# Patient Record
Sex: Female | Born: 1955 | Race: White | Hispanic: No | Marital: Married | Smoking: Current every day smoker
Health system: Southern US, Community
[De-identification: ages and names within clinical notes are randomized; demographics above are authoritative.]

---

## 1998-12-01 ENCOUNTER — Encounter: Payer: Self-pay | Admitting: Emergency Medicine

## 1998-12-01 ENCOUNTER — Emergency Department (HOSPITAL_COMMUNITY): Admission: EM | Admit: 1998-12-01 | Discharge: 1998-12-01 | Payer: Self-pay | Admitting: Emergency Medicine

## 2000-10-31 ENCOUNTER — Encounter: Payer: Self-pay | Admitting: Emergency Medicine

## 2000-10-31 ENCOUNTER — Emergency Department (HOSPITAL_COMMUNITY): Admission: EM | Admit: 2000-10-31 | Discharge: 2000-10-31 | Payer: Self-pay | Admitting: Emergency Medicine

## 2000-11-07 ENCOUNTER — Encounter: Admission: RE | Admit: 2000-11-07 | Discharge: 2000-11-07 | Payer: Self-pay | Admitting: Family Medicine

## 2000-11-07 ENCOUNTER — Encounter: Payer: Self-pay | Admitting: Family Medicine

## 2000-11-13 ENCOUNTER — Ambulatory Visit (HOSPITAL_COMMUNITY): Admission: RE | Admit: 2000-11-13 | Discharge: 2000-11-13 | Payer: Self-pay | Admitting: Family Medicine

## 2000-11-13 ENCOUNTER — Encounter: Payer: Self-pay | Admitting: Family Medicine

## 2013-10-04 ENCOUNTER — Emergency Department (HOSPITAL_COMMUNITY)
Admission: EM | Admit: 2013-10-04 | Discharge: 2013-10-04 | Disposition: A | Discharge status: Home / Self Care | Payer: 59 | Source: Home / Self Care

## 2013-10-04 ENCOUNTER — Emergency Department (HOSPITAL_COMMUNITY): Payer: 59

## 2013-10-04 ENCOUNTER — Emergency Department (INDEPENDENT_AMBULATORY_CARE_PROVIDER_SITE_OTHER): Payer: 59

## 2013-10-04 ENCOUNTER — Encounter (HOSPITAL_COMMUNITY): Payer: Self-pay | Admitting: Emergency Medicine

## 2013-10-04 DIAGNOSIS — S62113A Displaced fracture of triquetrum [cuneiform] bone, unspecified wrist, initial encounter for closed fracture: Secondary | ICD-10-CM

## 2013-10-04 MED ORDER — KETOROLAC TROMETHAMINE 60 MG/2ML IM SOLN
INTRAMUSCULAR | Status: AC
  Filled 2013-10-04: qty 2

## 2013-10-04 MED ORDER — KETOROLAC TROMETHAMINE 60 MG/2ML IM SOLN
60.0000 mg | Freq: Once | INTRAMUSCULAR | Status: AC
  Administered 2013-10-04: 60 mg via INTRAMUSCULAR

## 2013-10-04 MED ORDER — HYDROCODONE-ACETAMINOPHEN 5-325 MG PO TABS: 1.0000 | ORAL_TABLET | ORAL | Status: DC | PRN

## 2013-10-04 NOTE — Discharge Instructions (Signed)
Call Dr. Merrilee Seashore office Monday morning to schedule an appointment for follow up.    Cast or Splint Care Casts and splints support injured limbs and keep bones from moving while they heal. It is important to care for your cast or splint at home.  HOME CARE INSTRUCTIONS  Keep the cast or splint uncovered during the drying period. It can take 24 to 48 hours to dry if it is made of plaster. A fiberglass cast will dry in less than 1 hour.  Do not rest the cast on anything harder than a pillow for the first 24 hours.  Do not put weight on your injured limb or apply pressure to the cast until your health care provider gives you permission.  Keep the cast or splint dry. Wet casts or splints can lose their shape and may not support the limb as well. A wet cast that has lost its shape can also create harmful pressure on your skin when it dries. Also, wet skin can become infected.  Cover the cast or splint with a plastic bag when bathing or when out in the rain or snow. If the cast is on the trunk of the body, take sponge baths until the cast is removed.  If your cast does become wet, dry it with a towel or a blow dryer on the cool setting only.  Keep your cast or splint clean. Soiled casts may be wiped with a moistened cloth.  Do not place any hard or soft foreign objects under your cast or splint, such as cotton, toilet paper, lotion, or powder.  Do not try to scratch the skin under the cast with any object. The object could get stuck inside the cast. Also, scratching could lead to an infection. If itching is a problem, use a blow dryer on a cool setting to relieve discomfort.  Do not trim or cut your cast or remove padding from inside of it.  Exercise all joints next to the injury that are not immobilized by the cast or splint. For example, if you have a long leg cast, exercise the hip joint and toes. If you have an arm cast or splint, exercise the shoulder, elbow, thumb, and fingers.  Elevate  your injured arm or leg on 1 or 2 pillows for the first 1 to 3 days to decrease swelling and pain.It is best if you can comfortably elevate your cast so it is higher than your heart. SEEK MEDICAL CARE IF:   Your cast or splint cracks.  Your cast or splint is too tight or too loose.  You have unbearable itching inside the cast.  Your cast becomes wet or develops a soft spot or area.  You have a bad smell coming from inside your cast.  You get an object stuck under your cast.  Your skin around the cast becomes red or raw.  You have new pain or worsening pain after the cast has been applied. SEEK IMMEDIATE MEDICAL CARE IF:   You have fluid leaking through the cast.  You are unable to move your fingers or toes.  You have discolored (blue or white), cool, painful, or very swollen fingers or toes beyond the cast.  You have tingling or numbness around the injured area.  You have severe pain or pressure under the cast.  You have any difficulty with your breathing or have shortness of breath.  You have chest pain. Document Released: 08/18/2000 Document Revised: 06/11/2013 Document Reviewed: 02/27/2013 [facility] Patient Information 2014 [place].   Hand Fracture Your caregiver has diagnosed you with a fractured (broken) bone in your hand. If the bones are in good position and the hand is properly immobilized and rested, these injuries will usually heal in 3 to 6 weeks. A cast, splint, or bulky bandage is usually applied to keep the fracture site from moving. Do not remove the splint or cast until your caregiver approves. If the fracture is unstable or the bones are not aligned properly, surgery may be needed. Keep your hand raised (elevated) above the level of your heart as much as possible for the next 2 to 3 days until the swelling and pain are better. Apply ice packs for 15-20 minutes every 3 to 4 hours to help control the pain and swelling. See your caregiver or an orthopedic  specialist as directed for follow-up care to make sure the fracture is beginning to heal properly. SEEK IMMEDIATE MEDICAL CARE IF:   You notice your fingers are cold, numb, crooked, or the pain of your injury is severe.  You are not improving or seem to be getting worse.  You have questions or concerns. Document Released: 09/28/2004 Document Revised: 11/13/2011 Document Reviewed: 02/16/2009 [facility] Patient Information 2014 [place], [place].  RICE: Routine Care for Injuries The routine care of many injuries includes Rest, Ice, Compression, and Elevation (RICE). HOME CARE INSTRUCTIONS  Rest is needed to allow your body to heal. Routine activities can usually be resumed when comfortable. Injured tendons and bones can take up to 6 weeks to heal. Tendons are the cord-like structures that attach muscle to bone.  Ice following an injury helps keep the swelling down and reduces pain.  Put ice in a plastic bag.  Place a towel between your skin and the bag.  Leave the ice on for 15-20 minutes, 03-04 times a day. Do this while awake, for the first 24 to 48 hours. After that, continue as directed by your caregiver.  Compression helps keep swelling down. It also gives support and helps with discomfort. If an elastic bandage has been applied, it should be removed and reapplied every 3 to 4 hours. It should not be applied tightly, but firmly enough to keep swelling down. Watch fingers or toes for swelling, bluish discoloration, coldness, numbness, or excessive pain. If any of these problems occur, remove the bandage and reapply loosely. Contact your caregiver if these problems continue.  Elevation helps reduce swelling and decreases pain. With extremities, such as the arms, hands, legs, and feet, the injured area should be placed near or above the level of the heart, if possible. SEEK IMMEDIATE MEDICAL CARE IF:  You have persistent pain and swelling.  You develop redness, numbness, or unexpected  weakness.  Your symptoms are getting worse rather than improving after several days. These symptoms may indicate that further evaluation or further X-rays are needed. Sometimes, X-rays may not show a small broken bone (fracture) until 1 week or 10 days later. Make a follow-up appointment with your caregiver. Ask when your X-ray results will be ready. Make sure you get your X-ray results. Document Released: 12/03/2000 Document Revised: 11/13/2011 Document Reviewed: 01/20/2011 [facility] Patient Information 2014 [place], [place].

## 2013-10-04 NOTE — ED Provider Notes (Signed)
CSN: [id number]     Arrival date & time 10/04/13  1842 History   None    Chief Complaint  Patient presents with  . Wrist Injury  . Joint Swelling   (Consider location/radiation/quality/duration/timing/severity/associated sxs/prior Treatment)  HPI  The patient is a 58 year old female presenting following a fall roller skating onto her right wrist with her forearm extended.  Patient is reporting pain in her right wrist and states she's unable to fully extend her right elbow.   History reviewed. No pertinent past medical history. Past Surgical History  Procedure Laterality Date  . Cesarean section  1980 and 1986     x 2   Family History  Problem Relation Age of Onset  . Aplastic anemia Mother    History  Substance Use Topics  . Smoking status: Current Every Day Smoker -- 1.00 packs/day    Types: Cigarettes  . Smokeless tobacco: Not on file  . Alcohol Use: Yes     Comment: rarely   OB History   Grav Para Term Preterm Abortions TAB SAB Ect Mult Living                 Review of Systems  Constitutional: Negative.   HENT: Negative.   Eyes: Negative.   Respiratory: Negative.   Cardiovascular: Negative.   Gastrointestinal: Negative.   Endocrine: Negative.   Genitourinary: Negative.   Musculoskeletal: Positive for joint swelling.  Skin: Negative.   Allergic/Immunologic: Negative.   Neurological: Negative.   Hematological: Negative.   Psychiatric/Behavioral: Negative.     Allergies  Review of patient's allergies indicates no known allergies.  Home Medications   Current Outpatient Rx  Name  Route  Sig  Dispense  Refill  . HYDROcodone-acetaminophen (NORCO/VICODIN) 5-325 MG per tablet   Oral   Take 1-2 tablets by mouth every 4 (four) hours as needed.   20 tablet   0    There were no vitals taken for this visit. Physical Exam  Nursing note and vitals reviewed. Constitutional: She is oriented to person, place, and time. She appears well-developed and  well-nourished. No distress.  Eyes: Pupils are equal, round, and reactive to light.  Cardiovascular: Normal rate, regular rhythm, normal heart sounds and intact distal pulses.  Exam reveals no gallop and no friction rub.   No murmur heard. Pulmonary/Chest: Effort normal and breath sounds normal. No respiratory distress. She has no wheezes. She has no rales. She exhibits no tenderness.  Musculoskeletal: She exhibits edema and tenderness.       Right elbow: She exhibits no swelling, no effusion, no deformity and no laceration. Tenderness found. No radial head, no medial epicondyle, no lateral epicondyle and no olecranon process tenderness noted.       Right wrist: She exhibits tenderness, bony tenderness and swelling. She exhibits normal range of motion, no effusion, no crepitus, no deformity and no laceration.  Patient has full active range of motion, however reports straining discomfort. Passive range of motion is limited due to patient discomfort. No discomfort noted over bony prominences of right elbow no swelling effusion or deformity skin intact. Tenderness is present in soft tissue.  The right wrist is without obvious asymmetry or deformity compared to the left wrist. There is no surface trauma or open wounds. No scaphoid fullness or tenderness over patient reports tenderness over triquetrum.  Motor sensory function  intact and 2+ radial pulses present. Refill less than 3 seconds to distal phalanges.   Neurological: She is alert and oriented to  person, place, and time.  Skin: She is not diaphoretic.    ED Course  Procedures (including critical care time) Labs Review Labs Reviewed - No data to display Imaging Review Dg Elbow Complete Right  10/04/2013   CLINICAL DATA:  Larey SeatFell today with pain in the wrist, cannot straighten elbow  EXAM: RIGHT ELBOW - COMPLETE 3+ VIEW  COMPARISON:  None.  FINDINGS: There is no evidence of fracture, dislocation, or joint effusion. There is no evidence of  arthropathy or other focal bone abnormality. Soft tissues are unremarkable.  IMPRESSION: Negative.   Electronically Signed   By: Esperanza Heiraymond  Rubner M.D.   On: 10/04/2013 20:20   Dg Wrist Complete Right  10/04/2013   CLINICAL DATA:  Patient fell today with pain radial side of right wrist  EXAM: RIGHT WRIST - COMPLETE 3+ VIEW  COMPARISON:  None.  FINDINGS: On the lateral view there is a small bony fragment dorsally displaced at the level of the proximal carpal row. No other abnormalities except for mild arthritis of the first carpal metacarpal joint.  IMPRESSION: Findings suggest triquetrum fracture.   Electronically Signed   By: Esperanza Heiraymond  Rubner M.D.   On: 10/04/2013 20:18   Dr. Denyse Amassorey placed sugar tong splint with wrist anatomical position.  MDM   1. Triquetral chip fracture     Meds ordered this encounter  Medications  . ketorolac (TORADOL) injection 60 mg    Sig:   . HYDROcodone-acetaminophen (NORCO/VICODIN) 5-325 MG per tablet    Sig: Take 1-2 tablets by mouth every 4 (four) hours as needed.    Dispense:  20 tablet    Refill:  0    Plan of care discussed with Dr. Denyse Amassorey.  The patient to contact Dr. Merrilee SeashoreKuzma's office first thing Monday morning for follow up this week.     Weber Cooksatherine Rossi, NP 10/04/13 2048  I placed a well molded functional sugar tong splint.  Medical screening examination/treatment/procedure(s) were performed by a resident physician or non-physician practitioner and as the supervising physician I was immediately available for consultation/collaboration.  Clementeen GrahamEvan Corey, MD    Rodolph BongEvan S Corey, MD 10/06/13 [id number]

## 2013-10-04 NOTE — ED Notes (Signed)
[place] [employer] [place]  Fell on her R hand and her buttocks.  C/o pain R wrist and can't straighten her R elbow.  Has swelling in dorsum of R hand. R radial pulse present.

## 2016-05-16 ENCOUNTER — Ambulatory Visit (HOSPITAL_COMMUNITY)
Admission: EM | Admit: 2016-05-16 | Discharge: 2016-05-16 | Disposition: A | Discharge status: Home / Self Care | Payer: 59 | Attending: Family Medicine | Admitting: Family Medicine

## 2016-05-16 ENCOUNTER — Ambulatory Visit (INDEPENDENT_AMBULATORY_CARE_PROVIDER_SITE_OTHER): Payer: 59

## 2016-05-16 ENCOUNTER — Encounter (HOSPITAL_COMMUNITY): Payer: Self-pay | Admitting: Emergency Medicine

## 2016-05-16 DIAGNOSIS — M6580 Other synovitis and tenosynovitis, unspecified site: Secondary | ICD-10-CM

## 2016-05-16 DIAGNOSIS — M76892 Other specified enthesopathies of left lower limb, excluding foot: Secondary | ICD-10-CM

## 2016-05-16 MED ORDER — MELOXICAM 7.5 MG PO TABS: 7.5000 mg | ORAL_TABLET | Freq: Two times a day (BID) | ORAL | 1 refills | Status: DC

## 2016-05-16 NOTE — ED Provider Notes (Signed)
[facility]    CSN: [id number] Arrival date & time: 05/16/16  [id number]  First Provider Contact:  First MD Initiated Contact with Patient 05/16/16 1013        History   Chief Complaint Chief Complaint  Patient presents with  . Knee Pain    HPI Alice Lara is a 60 y.o. female.   The history is provided by the patient.  Knee Pain  Location:  Knee Time since incident:  2 weeks Injury: no   Knee location:  L knee Pain details:    Quality:  Aching   Severity:  Moderate   Onset quality:  Gradual   Duration:  2 weeks   Progression:  Unchanged Chronicity:  New Dislocation: no   Prior injury to area:  No Relieved by:  Compression and NSAIDs Associated symptoms: no back pain, no decreased ROM, no stiffness and no swelling     History reviewed. No pertinent past medical history.  There are no active problems to display for this patient.   Past Surgical History:  Procedure Laterality Date  . CESAREAN SECTION  1980 and 1986    x 2    OB History    No data available       Home Medications    Prior to Admission medications   Medication Sig Start Date End Date Taking? Authorizing Provider  ibuprofen (ADVIL,MOTRIN) 200 MG tablet Take 200 mg by mouth every 6 (six) hours as needed.   Yes Historical Provider, MD  HYDROcodone-acetaminophen (NORCO/VICODIN) 5-325 MG per tablet Take 1-2 tablets by mouth every 4 (four) hours as needed. Patient not taking: Reported on 05/16/2016 10/04/13   Servando Salinaatherine H Rossi, NP  meloxicam (MOBIC) 7.5 MG tablet Take 1 tablet (7.5 mg total) by mouth 2 (two) times daily after a meal. 05/16/16   Linna HoffJames D Kindl, MD    Family History Family History  Problem Relation Age of Onset  . Aplastic anemia Mother     Social History Social History  Substance Use Topics  . Smoking status: Current Every Day Smoker    Packs/day: 1.00    Types: Cigarettes  . Smokeless tobacco: Never Used  . Alcohol use Yes     Comment: rarely      Allergies   Review of patient's allergies indicates no known allergies.   Review of Systems Review of Systems  Musculoskeletal: Positive for arthralgias. Negative for back pain, gait problem, joint swelling and stiffness.  Skin: Negative.   All other systems reviewed and are negative.    Physical Exam Triage Vital Signs ED Triage Vitals [05/16/16 1012]  Enc Vitals Group     BP 158/85     Pulse Rate 65     Resp 14     Temp 98 F (36.7 C)     Temp Source Oral     SpO2 98 %     Weight      Height      Head Circumference      Peak Flow      Pain Score      Pain Loc      Pain Edu?      Excl. in GC?    No data found.   Updated Vital Signs BP 158/85 (BP Location: Right Arm)   Pulse 65   Temp 98 F (36.7 C) (Oral)   Resp 14   SpO2 98%   Visual Acuity Right Eye Distance:   Left Eye Distance:   Bilateral Distance:  Right Eye Near:   Left Eye Near:    Bilateral Near:     Physical Exam  Constitutional: She is oriented to person, place, and time. She appears well-developed and well-nourished.  Musculoskeletal: Normal range of motion. She exhibits tenderness. She exhibits no edema or deformity.       Left knee: She exhibits LCL laxity. She exhibits normal range of motion, no swelling, no effusion, no deformity, normal patellar mobility and no bony tenderness. Tenderness found. Lateral joint line tenderness noted.  Neurological: She is alert and oriented to person, place, and time.  Skin: Skin is warm and dry.  Nursing note and vitals reviewed.    UC Treatments / Results  Labs (all labs ordered are listed, but only abnormal results are displayed) Labs Reviewed - No data to display  EKG  EKG Interpretation None       Radiology No results found. X-rays reviewed and report per radiologist.  Procedures Procedures (including critical care time)  Medications Ordered in UC Medications - No data to display   Initial Impression / Assessment and  Plan / UC Course  I have reviewed the triage vital signs and the nursing notes.  Pertinent labs & imaging results that were available during my care of the patient were reviewed by me and considered in my medical decision making (see chart for details).  Clinical Course      Final Clinical Impressions(s) / UC Diagnoses   Final diagnoses:  Left knee tendonitis    New Prescriptions Discharge Medication List as of 05/16/2016 11:26 AM    START taking these medications   Details  meloxicam (MOBIC) 7.5 MG tablet Take 1 tablet (7.5 mg total) by mouth 2 (two) times daily after a meal., Starting Tue 05/16/2016, Print         Linna Hoff, MD 05/19/16 1011

## 2016-05-16 NOTE — Discharge Instructions (Signed)
Ice and meds as needed,continue cooper fit support,  see ortho if further problems

## 2016-05-16 NOTE — ED Triage Notes (Signed)
Patient denies any injury.  Denies prior history of any injury.  Patient reports left knee hurts with weight bearing or night.  The more walking, the more pain.  The later in the day pain worsens.    Patient has used ibuprofen and the "knee copper fit"

## 2021-06-17 ENCOUNTER — Other Ambulatory Visit: Payer: Self-pay | Admitting: Internal Medicine

## 2021-06-17 DIAGNOSIS — Z122 Encounter for screening for malignant neoplasm of respiratory organs: Secondary | ICD-10-CM

## 2021-06-22 ENCOUNTER — Other Ambulatory Visit: Payer: Self-pay | Admitting: Internal Medicine

## 2021-06-22 DIAGNOSIS — Z13820 Encounter for screening for osteoporosis: Secondary | ICD-10-CM

## 2021-06-23 ENCOUNTER — Other Ambulatory Visit: Payer: Self-pay | Admitting: Internal Medicine

## 2021-06-23 DIAGNOSIS — Z1231 Encounter for screening mammogram for malignant neoplasm of breast: Secondary | ICD-10-CM

## 2021-07-06 ENCOUNTER — Ambulatory Visit
Admission: RE | Admit: 2021-07-06 | Discharge: 2021-07-06 | Disposition: A | Discharge status: Home / Self Care | Payer: 59 | Source: Ambulatory Visit | Attending: Internal Medicine | Admitting: Internal Medicine

## 2021-07-06 DIAGNOSIS — Z1231 Encounter for screening mammogram for malignant neoplasm of breast: Secondary | ICD-10-CM

## 2021-11-02 ENCOUNTER — Other Ambulatory Visit: Payer: Self-pay

## 2021-11-02 ENCOUNTER — Ambulatory Visit
Admission: RE | Admit: 2021-11-02 | Discharge: 2021-11-02 | Disposition: A | Discharge status: Home / Self Care | Payer: 59 | Source: Ambulatory Visit | Attending: Nurse Practitioner | Admitting: Nurse Practitioner

## 2021-11-02 ENCOUNTER — Other Ambulatory Visit: Payer: Self-pay | Admitting: Nurse Practitioner

## 2021-11-02 ENCOUNTER — Ambulatory Visit
Admission: RE | Admit: 2021-11-02 | Discharge: 2021-11-02 | Disposition: A | Discharge status: Home / Self Care | Payer: No Typology Code available for payment source | Source: Ambulatory Visit | Attending: Nurse Practitioner | Admitting: Nurse Practitioner

## 2021-11-02 DIAGNOSIS — M25531 Pain in right wrist: Secondary | ICD-10-CM

## 2021-11-02 DIAGNOSIS — M79641 Pain in right hand: Secondary | ICD-10-CM

## 2023-06-29 ENCOUNTER — Other Ambulatory Visit: Payer: Self-pay | Admitting: Internal Medicine

## 2023-06-29 DIAGNOSIS — F17218 Nicotine dependence, cigarettes, with other nicotine-induced disorders: Secondary | ICD-10-CM

## 2023-08-21 IMAGING — CR DG WRIST COMPLETE 3+V*R*
4 series · 4 of 4 positions shown · non-contrast
Comparison: Concurrent hand radiograph, reported separately.

CLINICAL DATA: Right wrist pain. Fall catching self with right
hand, increasing pain. Question fracture.

EXAM:
RIGHT WRIST - COMPLETE 3+ VIEW

[x wrist lat right]
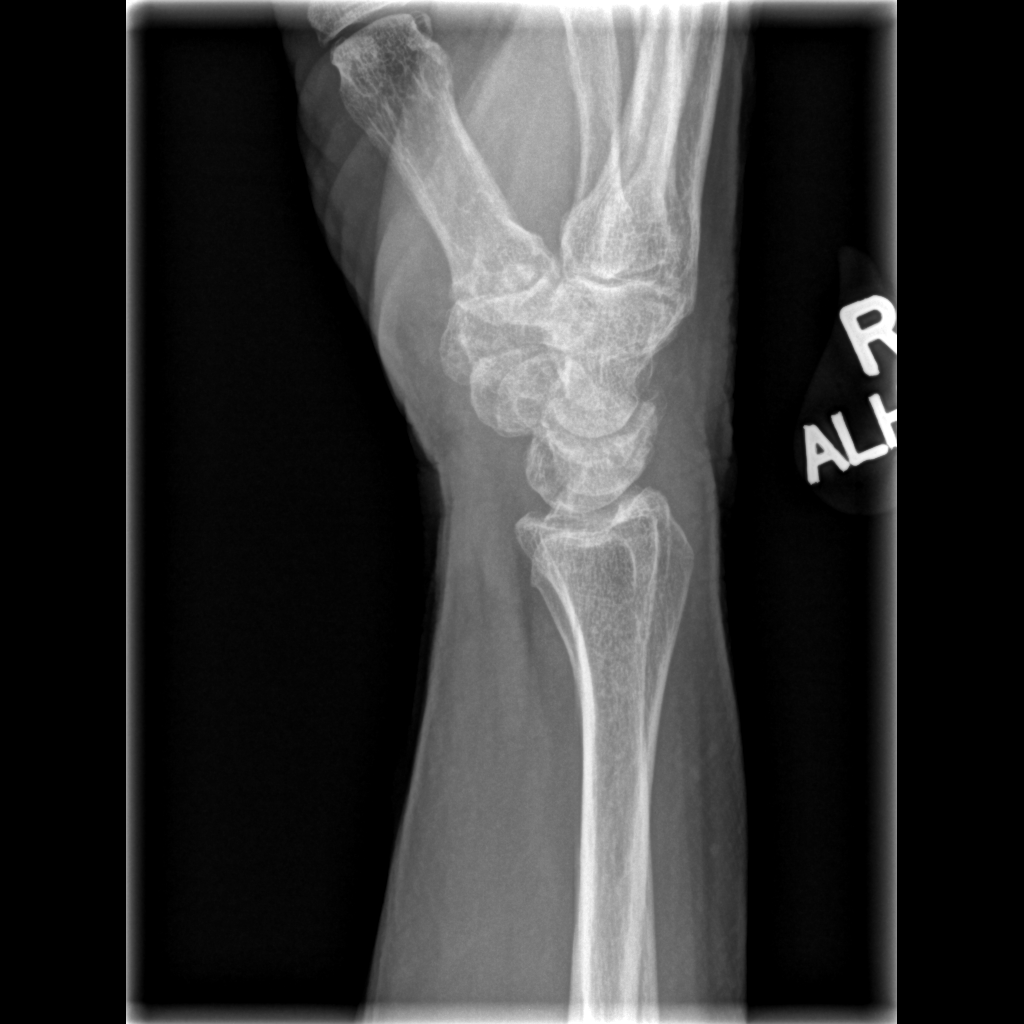

[x navicular]
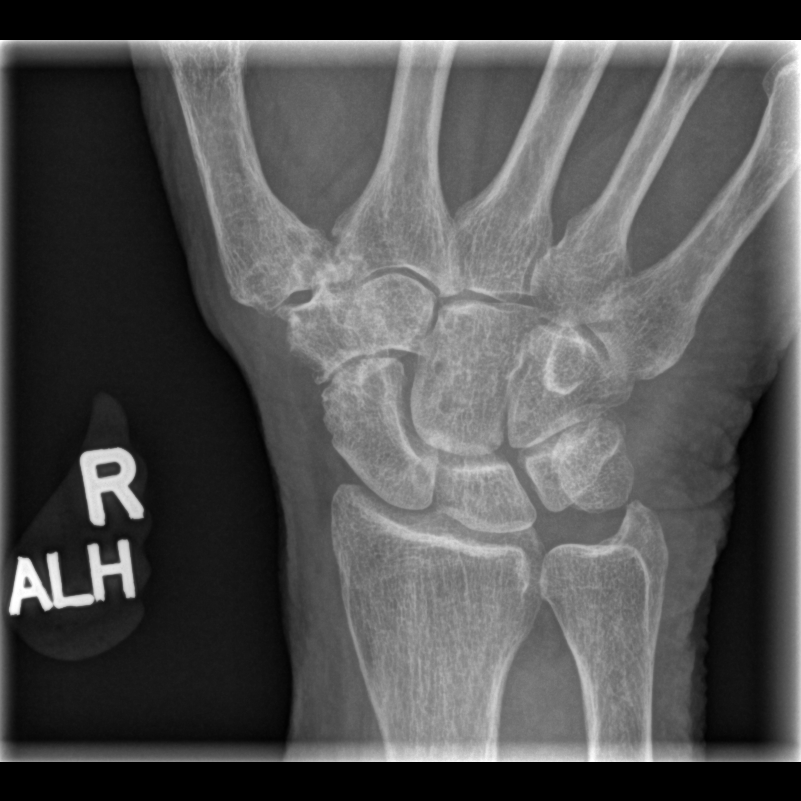

[x wrist pa right]
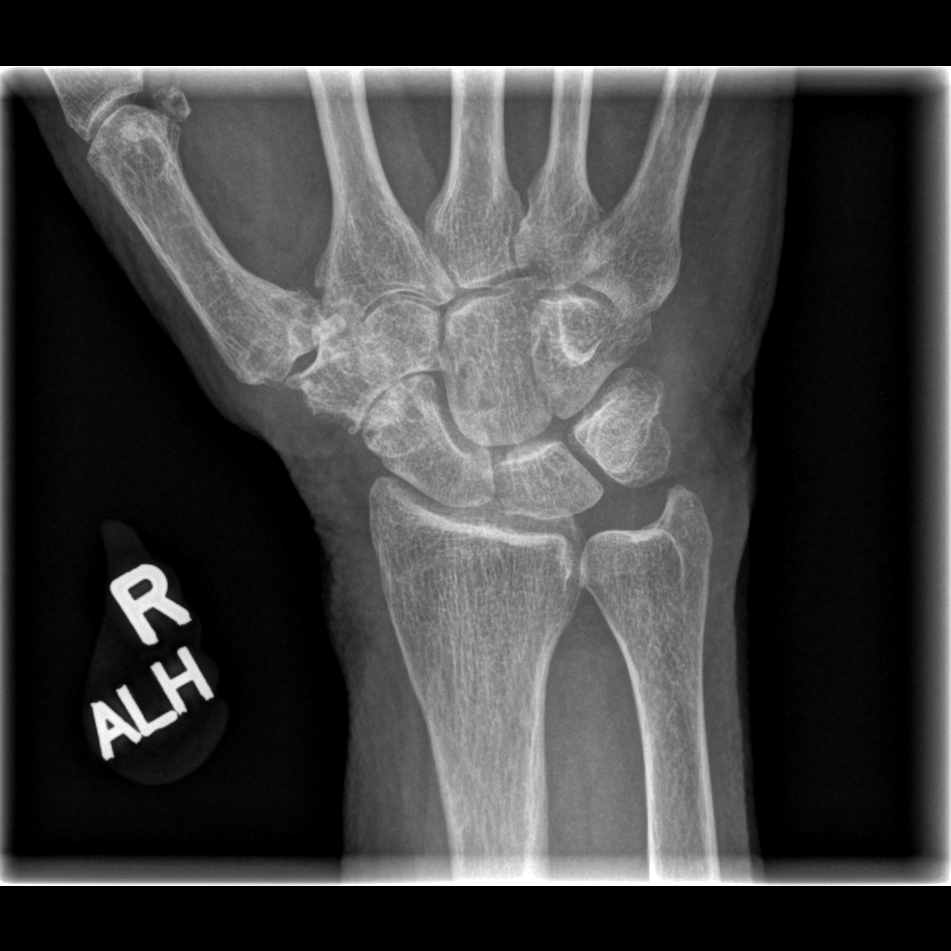

[x wrist obl right]
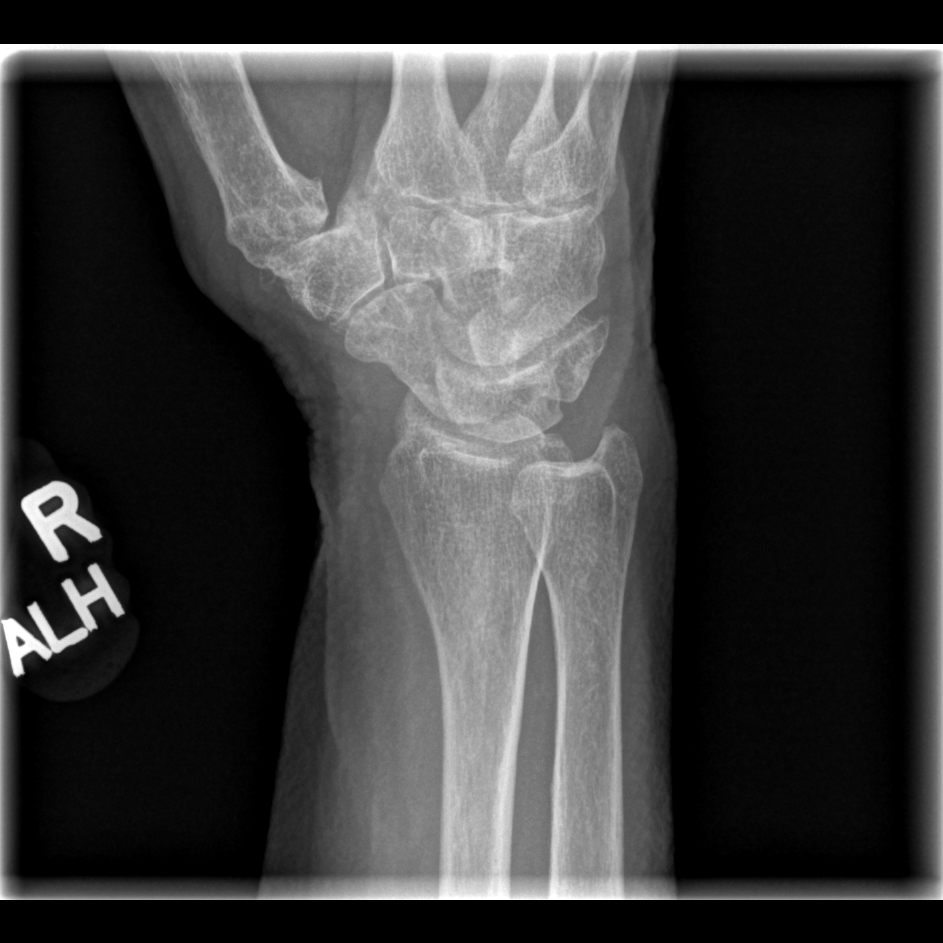

[4 of 4 positions shown; findings below may reference images not displayed]

FINDINGS: There is no evidence of fracture or dislocation. Normal alignment.
There are degenerative carpal bone cysts. Moderate osteoarthritis of
the thumb carpal metacarpal joint. Soft tissues are unremarkable.
IMPRESSION: 1. No acute fracture or subluxation.
2. Degenerative carpal bone cysts and thumb carpometacarpal joint
osteoarthritis.

## 2023-08-21 IMAGING — CR DG HAND COMPLETE 3+V*R*
3 series · 3 of 3 positions shown · non-contrast
Comparison: None.

CLINICAL DATA: Trauma, fall

EXAM:
RIGHT HAND - COMPLETE 3+ VIEW

[x hand pa right]
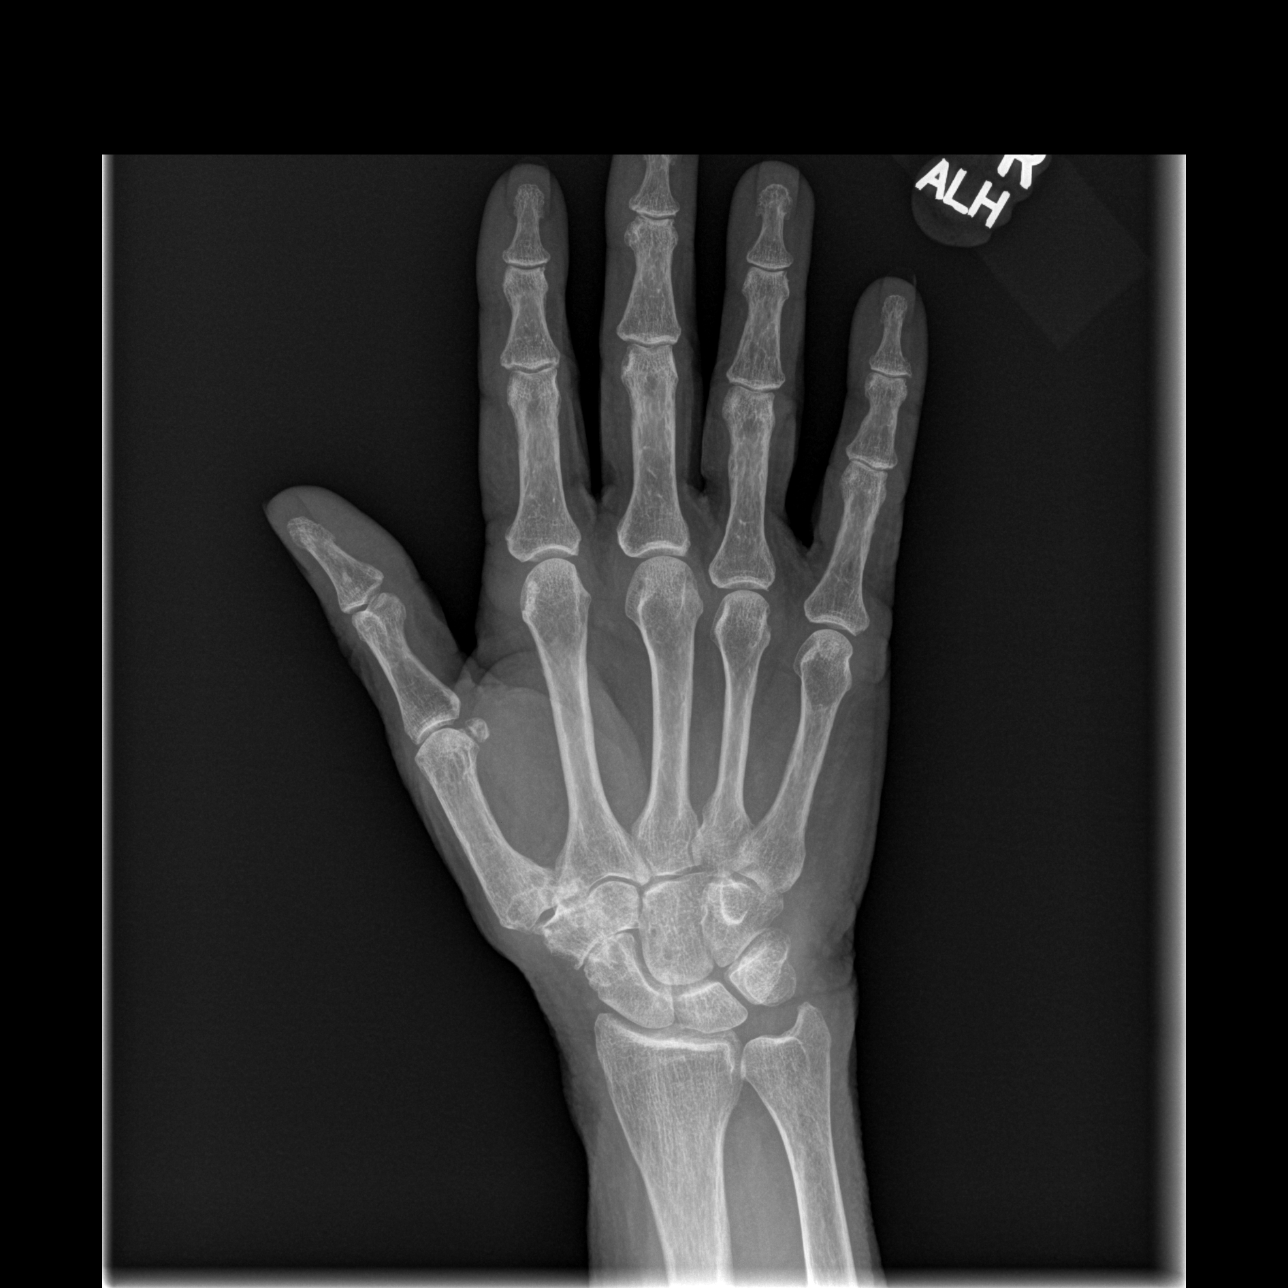

[x hand oblique right]
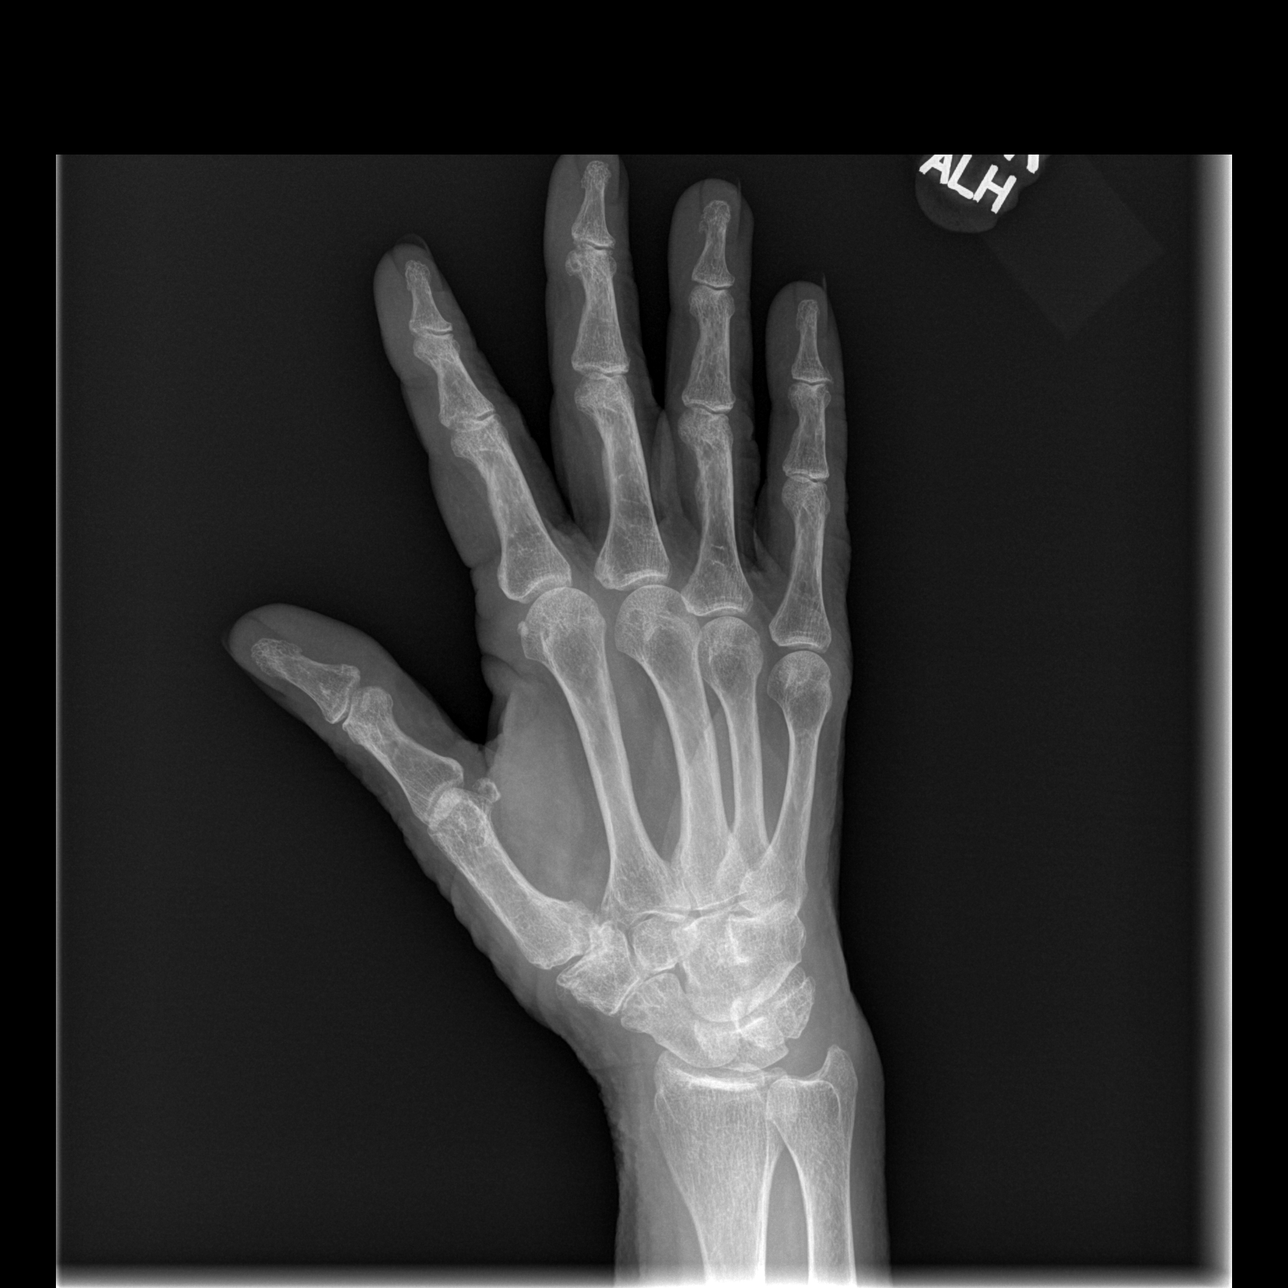

[x hand lat right]
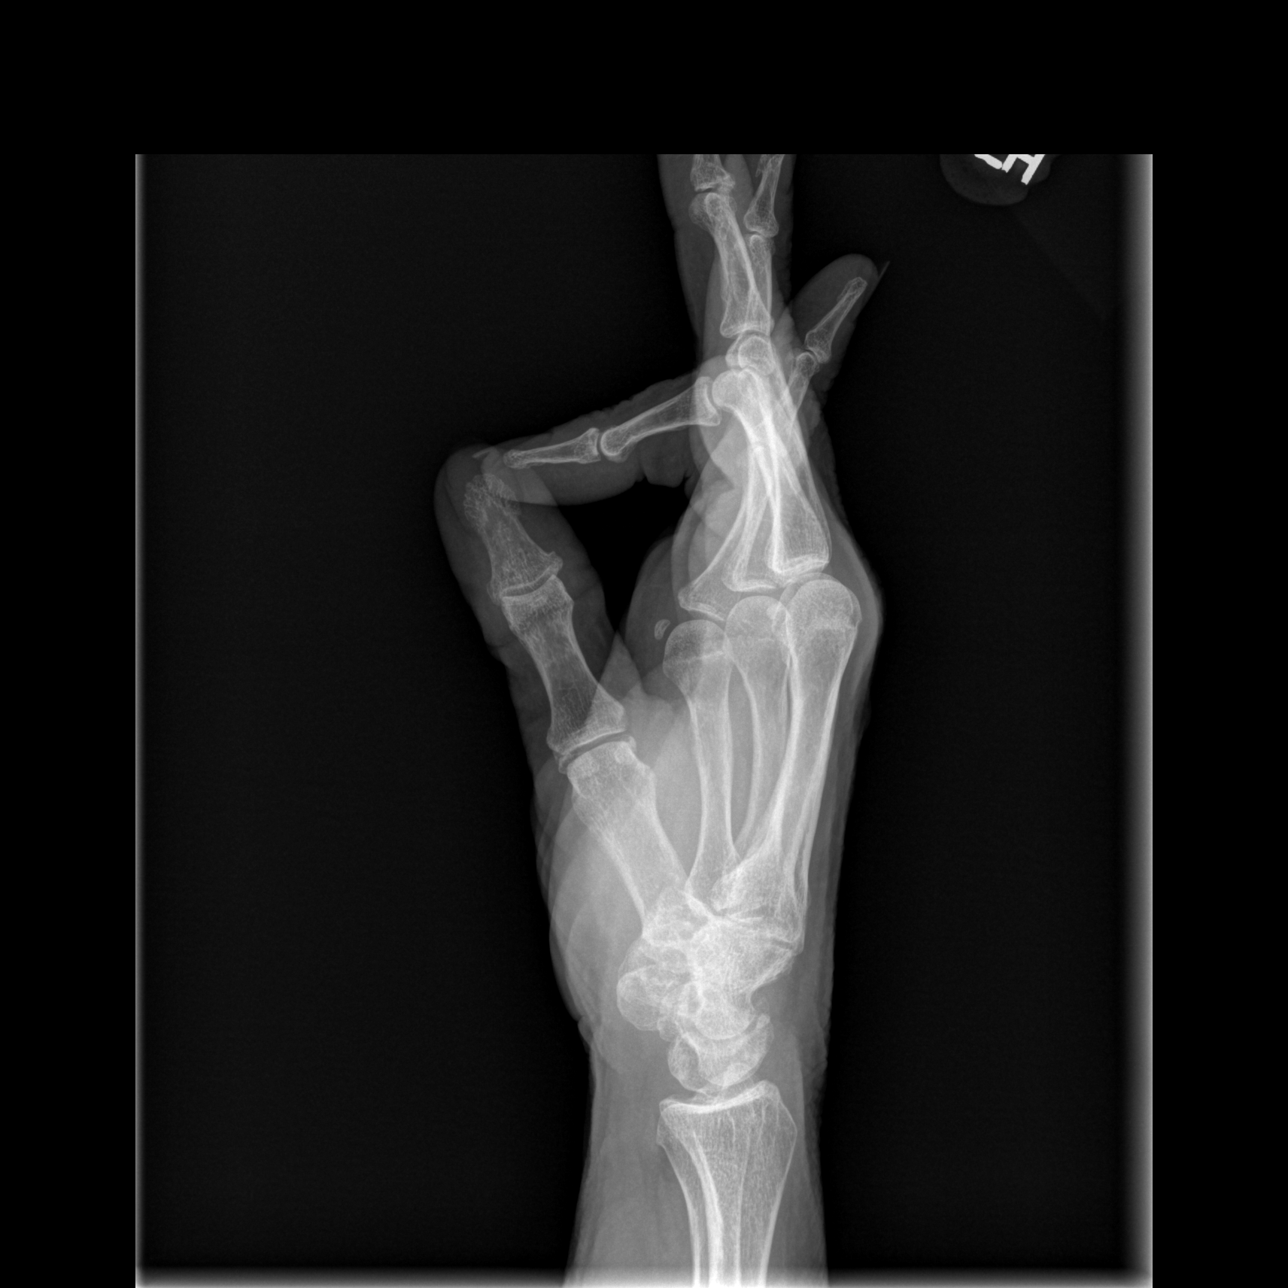

[3 of 3 positions shown; findings below may reference images not displayed]

FINDINGS: No recent fracture or dislocation is seen. Degenerative changes are
noted in the distal interphalangeal joint of middle finger.
Degenerative changes are noted in first carpometacarpal joint. There
is 9 mm lucency in the neck of fifth metacarpal. There is no break
in the cortical margins.
IMPRESSION: No recent fracture or dislocation is seen in right hand.

There is 9 mm lucency in the neck of right fifth metacarpal without
break in the cortical margins which may suggest benign process such
as bone cyst or enchondroma or active inflammatory or neoplastic
process.

Degenerative changes are noted in multiple joints, particularly
severe in the first carpometacarpal joint.

## 2024-07-04 ENCOUNTER — Other Ambulatory Visit (HOSPITAL_BASED_OUTPATIENT_CLINIC_OR_DEPARTMENT_OTHER): Payer: Self-pay | Admitting: Internal Medicine

## 2024-07-04 DIAGNOSIS — E7800 Pure hypercholesterolemia, unspecified: Secondary | ICD-10-CM

## 2024-10-07 ENCOUNTER — Inpatient Hospital Stay (HOSPITAL_COMMUNITY)

## 2024-10-07 ENCOUNTER — Encounter (HOSPITAL_COMMUNITY): Admission: EM | Payer: Self-pay | Source: Home / Self Care | Attending: Neurology

## 2024-10-07 ENCOUNTER — Inpatient Hospital Stay (HOSPITAL_COMMUNITY): Admission: EM | Admit: 2024-10-07 | Source: Home / Self Care | Attending: Neurology | Admitting: Neurology

## 2024-10-07 ENCOUNTER — Emergency Department (HOSPITAL_COMMUNITY)

## 2024-10-07 ENCOUNTER — Other Ambulatory Visit: Payer: Self-pay

## 2024-10-07 ENCOUNTER — Encounter (HOSPITAL_COMMUNITY): Payer: Self-pay

## 2024-10-07 ENCOUNTER — Emergency Department (HOSPITAL_COMMUNITY): Admitting: Certified Registered Nurse Anesthetist

## 2024-10-07 DIAGNOSIS — E039 Hypothyroidism, unspecified: Secondary | ICD-10-CM | POA: Diagnosis not present

## 2024-10-07 DIAGNOSIS — R29723 NIHSS score 23: Secondary | ICD-10-CM | POA: Diagnosis not present

## 2024-10-07 DIAGNOSIS — I63519 Cerebral infarction due to unspecified occlusion or stenosis of unspecified middle cerebral artery: Principal | ICD-10-CM

## 2024-10-07 DIAGNOSIS — Z720 Tobacco use: Secondary | ICD-10-CM | POA: Diagnosis not present

## 2024-10-07 DIAGNOSIS — Z9889 Other specified postprocedural states: Secondary | ICD-10-CM

## 2024-10-07 DIAGNOSIS — I63512 Cerebral infarction due to unspecified occlusion or stenosis of left middle cerebral artery: Secondary | ICD-10-CM | POA: Diagnosis not present

## 2024-10-07 DIAGNOSIS — I10 Essential (primary) hypertension: Secondary | ICD-10-CM | POA: Diagnosis not present

## 2024-10-07 DIAGNOSIS — Z95828 Presence of other vascular implants and grafts: Secondary | ICD-10-CM

## 2024-10-07 DIAGNOSIS — J9601 Acute respiratory failure with hypoxia: Secondary | ICD-10-CM

## 2024-10-07 DIAGNOSIS — F17200 Nicotine dependence, unspecified, uncomplicated: Secondary | ICD-10-CM | POA: Diagnosis not present

## 2024-10-07 DIAGNOSIS — J9600 Acute respiratory failure, unspecified whether with hypoxia or hypercapnia: Secondary | ICD-10-CM | POA: Diagnosis not present

## 2024-10-07 DIAGNOSIS — I639 Cerebral infarction, unspecified: Secondary | ICD-10-CM | POA: Diagnosis present

## 2024-10-07 DIAGNOSIS — R569 Unspecified convulsions: Secondary | ICD-10-CM

## 2024-10-07 DIAGNOSIS — R1310 Dysphagia, unspecified: Secondary | ICD-10-CM | POA: Diagnosis not present

## 2024-10-07 DIAGNOSIS — D649 Anemia, unspecified: Secondary | ICD-10-CM

## 2024-10-07 LAB — MRSA NEXT GEN BY PCR, NASAL: MRSA by PCR Next Gen: NOT DETECTED

## 2024-10-07 LAB — MAGNESIUM: Magnesium: 2 mg/dL (ref 1.7–2.4)

## 2024-10-07 LAB — CBC WITH DIFFERENTIAL/PLATELET
Abs Immature Granulocytes: 0.03 10*3/uL (ref 0.00–0.07)
Basophils Absolute: 0.1 10*3/uL (ref 0.0–0.1)
Basophils Relative: 1 %
Eosinophils Absolute: 0.1 10*3/uL (ref 0.0–0.5)
Eosinophils Relative: 1 %
HCT: 38.6 % (ref 36.0–46.0)
Hemoglobin: 13.1 g/dL (ref 12.0–15.0)
Immature Granulocytes: 0 %
Lymphocytes Relative: 15 %
Lymphs Abs: 1.6 10*3/uL (ref 0.7–4.0)
MCH: 30.5 pg (ref 26.0–34.0)
MCHC: 33.9 g/dL (ref 30.0–36.0)
MCV: 90 fL (ref 80.0–100.0)
Monocytes Absolute: 0.5 10*3/uL (ref 0.1–1.0)
Monocytes Relative: 5 %
Neutro Abs: 7.9 10*3/uL — ABNORMAL HIGH (ref 1.7–7.7)
Neutrophils Relative %: 78 %
Platelets: 280 10*3/uL (ref 150–400)
RBC: 4.29 MIL/uL (ref 3.87–5.11)
RDW: 12.8 % (ref 11.5–15.5)
WBC: 10.1 10*3/uL (ref 4.0–10.5)
nRBC: 0 % (ref 0.0–0.2)

## 2024-10-07 LAB — URINE DRUG SCREEN
Amphetamines: NEGATIVE
Barbiturates: NEGATIVE
Benzodiazepines: POSITIVE — AB
Cocaine: NEGATIVE
Fentanyl: POSITIVE — AB
Methadone Scn, Ur: NEGATIVE
Opiates: NEGATIVE
Tetrahydrocannabinol: NEGATIVE

## 2024-10-07 LAB — GLUCOSE, CAPILLARY
Glucose-Capillary: 185 mg/dL — ABNORMAL HIGH (ref 70–99)
Glucose-Capillary: 233 mg/dL — ABNORMAL HIGH (ref 70–99)

## 2024-10-07 LAB — URINALYSIS, COMPLETE (UACMP) WITH MICROSCOPIC
Bacteria, UA: NONE SEEN
Bilirubin Urine: NEGATIVE
Glucose, UA: NEGATIVE mg/dL
Hgb urine dipstick: NEGATIVE
Ketones, ur: NEGATIVE mg/dL
Leukocytes,Ua: NEGATIVE
Nitrite: NEGATIVE
Protein, ur: NEGATIVE mg/dL
RBC / HPF: NONE SEEN RBC/hpf (ref 0–5)
Specific Gravity, Urine: <1.005 — ABNORMAL LOW (ref 1.005–1.030)
WBC, UA: NONE SEEN WBC/hpf (ref 0–5)
pH: 6 (ref 5.0–8.0)

## 2024-10-07 LAB — BLOOD GAS, VENOUS
Acid-Base Excess: 2.2 mmol/L — ABNORMAL HIGH (ref 0.0–2.0)
Bicarbonate: 27.3 mmol/L (ref 20.0–28.0)
Drawn by: 73734
O2 Saturation: 98.2 %
Patient temperature: 35.9
pCO2, Ven: 41 mmHg — ABNORMAL LOW (ref 44–60)
pH, Ven: 7.43 (ref 7.25–7.43)
pO2, Ven: 72 mmHg — ABNORMAL HIGH (ref 32–45)

## 2024-10-07 LAB — BASIC METABOLIC PANEL WITH GFR
Anion gap: 15 (ref 5–15)
BUN: 18 mg/dL (ref 8–23)
CO2: 22 mmol/L (ref 22–32)
Calcium: 8.7 mg/dL — ABNORMAL LOW (ref 8.9–10.3)
Chloride: 100 mmol/L (ref 98–111)
Creatinine, Ser: 1.02 mg/dL — ABNORMAL HIGH (ref 0.44–1.00)
GFR, Estimated: 59 mL/min — ABNORMAL LOW
Glucose, Bld: 134 mg/dL — ABNORMAL HIGH (ref 70–99)
Potassium: 3.5 mmol/L (ref 3.5–5.1)
Sodium: 138 mmol/L (ref 135–145)

## 2024-10-07 LAB — HEMOGLOBIN A1C
Hgb A1c MFr Bld: 5.6 % (ref 4.8–5.6)
Mean Plasma Glucose: 114.02 mg/dL

## 2024-10-07 LAB — HIV ANTIBODY (ROUTINE TESTING W REFLEX): HIV Screen 4th Generation wRfx: NONREACTIVE

## 2024-10-07 LAB — TROPONIN T, HIGH SENSITIVITY
Troponin T High Sensitivity: 6 ng/L (ref 0–19)
Troponin T High Sensitivity: 11 ng/L (ref 0–19)

## 2024-10-07 LAB — CBG MONITORING, ED: Glucose-Capillary: 153 mg/dL — ABNORMAL HIGH (ref 70–99)

## 2024-10-07 MED ORDER — SODIUM CHLORIDE 0.9 % IV SOLN: 250.0000 mL | INTRAVENOUS | Status: AC

## 2024-10-07 MED ORDER — SODIUM CHLORIDE 0.9 % IV BOLUS: 250.0000 mL | INTRAVENOUS | Status: AC | PRN

## 2024-10-07 MED ORDER — TICAGRELOR 90 MG PO TABS
90.0000 mg | ORAL_TABLET | Freq: Two times a day (BID) | ORAL | Status: AC
  Administered 2024-10-08 – 2024-10-10 (×5): 90 mg
  Filled 2024-10-07 (×6): qty 1

## 2024-10-07 MED ORDER — LEVETIRACETAM (KEPPRA) 500 MG/5 ML ADULT IV PUSH
1000.0000 mg | Freq: Once | INTRAVENOUS | Status: DC
  Filled 2024-10-07: qty 10

## 2024-10-07 MED ORDER — ACETAMINOPHEN 160 MG/5ML PO SOLN
650.0000 mg | ORAL | Status: AC | PRN
  Administered 2024-10-09 – 2024-10-10 (×3): 650 mg
  Filled 2024-10-07 (×3): qty 20.3

## 2024-10-07 MED ORDER — PHENYLEPHRINE 80 MCG/ML (10ML) SYRINGE FOR IV PUSH (FOR BLOOD PRESSURE SUPPORT)
PREFILLED_SYRINGE | INTRAVENOUS | Status: DC | PRN
  Administered 2024-10-07: 80 ug via INTRAVENOUS
  Administered 2024-10-07: 160 ug via INTRAVENOUS

## 2024-10-07 MED ORDER — SODIUM CHLORIDE 0.9 % IV SOLN
2.0000 ug/kg/min | INTRAVENOUS | Status: AC
  Administered 2024-10-07: 2 ug/kg/min via INTRAVENOUS
  Filled 2024-10-07 (×2): qty 50

## 2024-10-07 MED ORDER — ORAL CARE MOUTH RINSE
15.0000 mL | OROMUCOSAL | Status: DC
  Administered 2024-10-07 – 2024-10-08 (×12): 15 mL via OROMUCOSAL

## 2024-10-07 MED ORDER — HYDRALAZINE HCL 20 MG/ML IJ SOLN
10.0000 mg | INTRAMUSCULAR | Status: AC | PRN
  Administered 2024-10-10: 20 mg via INTRAVENOUS
  Filled 2024-10-07: qty 1

## 2024-10-07 MED ORDER — SENNOSIDES-DOCUSATE SODIUM 8.6-50 MG PO TABS: 1.0000 | ORAL_TABLET | Freq: Every evening | ORAL | Status: AC | PRN

## 2024-10-07 MED ORDER — ASPIRIN 81 MG PO CHEW
81.0000 mg | CHEWABLE_TABLET | Freq: Every day | ORAL | Status: AC
  Administered 2024-10-08 – 2024-10-10 (×3): 81 mg
  Filled 2024-10-07 (×3): qty 1

## 2024-10-07 MED ORDER — FAMOTIDINE 20 MG PO TABS
20.0000 mg | ORAL_TABLET | Freq: Two times a day (BID) | ORAL | Status: AC
  Administered 2024-10-07 – 2024-10-10 (×6): 20 mg
  Filled 2024-10-07 (×7): qty 1

## 2024-10-07 MED ORDER — STROKE: EARLY STAGES OF RECOVERY BOOK
Freq: Once | Status: AC
  Administered 2024-10-08: 1
  Filled 2024-10-07: qty 1

## 2024-10-07 MED ORDER — FENTANYL 2500MCG IN NS 250ML (10MCG/ML) PREMIX INFUSION
0.0000 ug/h | INTRAVENOUS | Status: DC
  Administered 2024-10-07: 25 ug/h via INTRAVENOUS
  Filled 2024-10-07: qty 250

## 2024-10-07 MED ORDER — POTASSIUM CHLORIDE 20 MEQ PO PACK
40.0000 meq | PACK | Freq: Once | ORAL | Status: AC
  Administered 2024-10-07: 40 meq
  Filled 2024-10-07: qty 2

## 2024-10-07 MED ORDER — ASPIRIN 81 MG PO CHEW
81.0000 mg | CHEWABLE_TABLET | Freq: Once | ORAL | Status: AC
  Administered 2024-10-07: 81 mg
  Filled 2024-10-07: qty 1

## 2024-10-07 MED ORDER — FENTANYL CITRATE (PF) 50 MCG/ML IJ SOSY
50.0000 ug | PREFILLED_SYRINGE | INTRAMUSCULAR | Status: AC | PRN
  Administered 2024-10-07 – 2024-10-08 (×6): 50 ug via INTRAVENOUS
  Filled 2024-10-07 (×2): qty 1

## 2024-10-07 MED ORDER — LEVETIRACETAM (KEPPRA) 500 MG/5 ML ADULT IV PUSH
20.0000 mg/kg | Freq: Once | INTRAVENOUS | Status: AC
  Administered 2024-10-07: 1250 mg via INTRAVENOUS
  Filled 2024-10-07: qty 15

## 2024-10-07 MED ORDER — TICAGRELOR 90 MG PO TABS: 180.0000 mg | ORAL_TABLET | Freq: Once | ORAL | Status: DC

## 2024-10-07 MED ORDER — HYALURONIDASE HUMAN 150 UNIT/ML IJ SOLN
150.0000 [IU] | Freq: Once | INTRAMUSCULAR | Status: DC
  Filled 2024-10-07: qty 1

## 2024-10-07 MED ORDER — ONDANSETRON HCL 4 MG/2ML IJ SOLN
INTRAMUSCULAR | Status: DC | PRN
  Administered 2024-10-07: 4 mg via INTRAVENOUS

## 2024-10-07 MED ORDER — ATROPINE SULFATE 0.4 MG/ML IV SOLN
INTRAVENOUS | Status: DC | PRN
  Administered 2024-10-07: .5 mg via INTRAVENOUS

## 2024-10-07 MED ORDER — STERILE WATER FOR INJECTION IJ SOLN
INTRAMUSCULAR | Status: AC
  Filled 2024-10-07: qty 10

## 2024-10-07 MED ORDER — CLEVIDIPINE BUTYRATE 0.5 MG/ML IV EMUL: 0.0000 mg/h | INTRAVENOUS | Status: DC

## 2024-10-07 MED ORDER — SODIUM CHLORIDE 0.9 % IV SOLN: INTRAVENOUS | Status: AC

## 2024-10-07 MED ORDER — PROPOFOL 1000 MG/100ML IV EMUL
0.0000 ug/kg/min | INTRAVENOUS | Status: DC
  Administered 2024-10-07 (×2): 70 ug/kg/min via INTRAVENOUS
  Administered 2024-10-08: 5 ug/kg/min via INTRAVENOUS
  Administered 2024-10-08: 40 ug/kg/min via INTRAVENOUS
  Filled 2024-10-07 (×3): qty 100

## 2024-10-07 MED ORDER — FENTANYL CITRATE (PF) 50 MCG/ML IJ SOSY
50.0000 ug | PREFILLED_SYRINGE | INTRAMUSCULAR | Status: DC | PRN
  Filled 2024-10-07: qty 1
  Filled 2024-10-07: qty 2

## 2024-10-07 MED ORDER — DEXAMETHASONE SOD PHOSPHATE PF 10 MG/ML IJ SOLN
INTRAMUSCULAR | Status: DC | PRN
  Administered 2024-10-07: 5 mg via INTRAVENOUS

## 2024-10-07 MED ORDER — PROPOFOL 10 MG/ML IV BOLUS
INTRAVENOUS | Status: DC | PRN
  Administered 2024-10-07: 100 mg via INTRAVENOUS

## 2024-10-07 MED ORDER — LIDOCAINE HCL (CARDIAC) PF 100 MG/5ML IV SOSY
PREFILLED_SYRINGE | INTRAVENOUS | Status: DC | PRN
  Administered 2024-10-07: 50 mg via INTRAVENOUS

## 2024-10-07 MED ORDER — ACETAMINOPHEN 325 MG PO TABS: 650.0000 mg | ORAL_TABLET | ORAL | Status: AC | PRN

## 2024-10-07 MED ORDER — SODIUM CHLORIDE 0.9 % IV SOLN
0.7500 ug/kg/min | INTRAVENOUS | Status: DC
  Administered 2024-10-07: 0.75 ug/kg/min via INTRAVENOUS
  Filled 2024-10-07: qty 50

## 2024-10-07 MED ORDER — IOHEXOL 350 MG/ML SOLN
75.0000 mL | Freq: Once | INTRAVENOUS | Status: AC | PRN
  Administered 2024-10-07: 75 mL via INTRAVENOUS

## 2024-10-07 MED ORDER — ASPIRIN 81 MG PO CHEW: 81.0000 mg | CHEWABLE_TABLET | Freq: Once | ORAL | Status: DC

## 2024-10-07 MED ORDER — PANTOPRAZOLE SODIUM 40 MG IV SOLR
40.0000 mg | Freq: Every day | INTRAVENOUS | Status: DC
  Administered 2024-10-07: 40 mg via INTRAVENOUS
  Filled 2024-10-07: qty 10

## 2024-10-07 MED ORDER — CANGRELOR BOLUS VIA INFUSION
INTRAVENOUS | Status: DC | PRN
  Administered 2024-10-07: 1020 ug via INTRAVENOUS

## 2024-10-07 MED ORDER — SODIUM CHLORIDE 0.9 % IV SOLN
INTRAVENOUS | Status: AC | PRN
  Administered 2024-10-07: 2 ug/kg/min via INTRAVENOUS

## 2024-10-07 MED ORDER — PHENYLEPHRINE HCL-NACL 20-0.9 MG/250ML-% IV SOLN
INTRAVENOUS | Status: DC | PRN
  Administered 2024-10-07: 15 ug/min via INTRAVENOUS

## 2024-10-07 MED ORDER — CHLORHEXIDINE GLUCONATE CLOTH 2 % EX PADS
6.0000 | MEDICATED_PAD | Freq: Every day | CUTANEOUS | Status: AC
  Administered 2024-10-07 – 2024-10-10 (×3): 6 via TOPICAL

## 2024-10-07 MED ORDER — LABETALOL HCL 5 MG/ML IV SOLN
10.0000 mg | INTRAVENOUS | Status: AC | PRN
  Administered 2024-10-10: 20 mg via INTRAVENOUS
  Filled 2024-10-07 (×2): qty 4

## 2024-10-07 MED ORDER — CANGRELOR TETRASODIUM 50 MG IV SOLR
INTRAVENOUS | Status: AC
  Filled 2024-10-07: qty 50

## 2024-10-07 MED ORDER — NOREPINEPHRINE 4 MG/250ML-% IV SOLN
0.0000 ug/min | INTRAVENOUS | Status: DC
  Administered 2024-10-07: 4 ug/min via INTRAVENOUS
  Administered 2024-10-07: 2 ug/min via INTRAVENOUS
  Administered 2024-10-08: 9 ug/min via INTRAVENOUS
  Administered 2024-10-08: 8 ug/min via INTRAVENOUS
  Administered 2024-10-08: 9 ug/min via INTRAVENOUS
  Administered 2024-10-09: 6 ug/min via INTRAVENOUS
  Filled 2024-10-07 (×5): qty 250

## 2024-10-07 MED ORDER — POLYETHYLENE GLYCOL 3350 17 G PO PACK
17.0000 g | PACK | Freq: Every day | ORAL | Status: DC
  Administered 2024-10-07: 17 g
  Filled 2024-10-07: qty 1

## 2024-10-07 MED ORDER — TICAGRELOR 90 MG PO TABS
180.0000 mg | ORAL_TABLET | Freq: Once | ORAL | Status: AC
  Administered 2024-10-07: 180 mg
  Filled 2024-10-07: qty 2

## 2024-10-07 MED ORDER — LACTATED RINGERS IV SOLN: INTRAVENOUS | Status: DC | PRN

## 2024-10-07 MED ORDER — PROPOFOL 500 MG/50ML IV EMUL
INTRAVENOUS | Status: DC | PRN
  Administered 2024-10-07: 70 ug/kg/min via INTRAVENOUS

## 2024-10-07 MED ORDER — PROPOFOL 1000 MG/100ML IV EMUL
INTRAVENOUS | Status: AC
  Filled 2024-10-07: qty 100

## 2024-10-07 MED ORDER — LORAZEPAM 2 MG/ML IJ SOLN
INTRAMUSCULAR | Status: AC
  Administered 2024-10-07: 4 mg via INTRAVENOUS
  Filled 2024-10-07: qty 2

## 2024-10-07 MED ORDER — EPHEDRINE SULFATE-NACL 50-0.9 MG/10ML-% IV SOSY
PREFILLED_SYRINGE | INTRAVENOUS | Status: DC | PRN
  Administered 2024-10-07: 5 mg via INTRAVENOUS
  Administered 2024-10-07: 2.5 mg via INTRAVENOUS

## 2024-10-07 MED ORDER — IOHEXOL 300 MG/ML  SOLN
150.0000 mL | Freq: Once | INTRAMUSCULAR | Status: AC | PRN
  Administered 2024-10-07: 60 mL via INTRA_ARTERIAL

## 2024-10-07 MED ORDER — ROCURONIUM BROMIDE 10 MG/ML (PF) SYRINGE
PREFILLED_SYRINGE | INTRAVENOUS | Status: DC | PRN
  Administered 2024-10-07: 80 mg via INTRAVENOUS

## 2024-10-07 MED ORDER — ORAL CARE MOUTH RINSE: 15.0000 mL | OROMUCOSAL | Status: DC | PRN

## 2024-10-07 MED ORDER — DIPHENHYDRAMINE HCL 50 MG/ML IJ SOLN
50.0000 mg | Freq: Once | INTRAMUSCULAR | Status: AC
  Administered 2024-10-07: 50 mg via INTRAVENOUS
  Filled 2024-10-07: qty 1

## 2024-10-07 MED ORDER — LORAZEPAM 2 MG/ML IJ SOLN
2.0000 mg | Freq: Once | INTRAMUSCULAR | Status: AC
  Administered 2024-10-07: 2 mg via INTRAVENOUS
  Filled 2024-10-07: qty 1

## 2024-10-07 MED ORDER — TENECTEPLASE 25 MG IV KIT
0.2500 mg/kg | PACK | Freq: Once | INTRAVENOUS | Status: AC
  Administered 2024-10-07: 17 mg via INTRAVENOUS
  Filled 2024-10-07: qty 5

## 2024-10-07 MED ORDER — LORAZEPAM 2 MG/ML IJ SOLN: 4.0000 mg | Freq: Once | INTRAMUSCULAR | Status: AC

## 2024-10-07 MED ORDER — LEVETIRACETAM (KEPPRA) 500 MG/5 ML ADULT IV PUSH
500.0000 mg | Freq: Two times a day (BID) | INTRAVENOUS | Status: DC
  Administered 2024-10-08 – 2024-10-10 (×5): 500 mg via INTRAVENOUS
  Filled 2024-10-07 (×5): qty 5

## 2024-10-07 MED ORDER — LACTATED RINGERS IV BOLUS
1000.0000 mL | Freq: Once | INTRAVENOUS | Status: AC
  Administered 2024-10-07: 1000 mL via INTRAVENOUS

## 2024-10-07 MED ORDER — ACETAMINOPHEN 650 MG RE SUPP: 650.0000 mg | RECTAL | Status: AC | PRN

## 2024-10-07 MED ORDER — INSULIN ASPART 100 UNIT/ML IJ SOLN
0.0000 [IU] | INTRAMUSCULAR | Status: AC
  Administered 2024-10-07: 1 [IU] via SUBCUTANEOUS
  Administered 2024-10-08: 2 [IU] via SUBCUTANEOUS
  Administered 2024-10-08 – 2024-10-09 (×2): 1 [IU] via SUBCUTANEOUS
  Filled 2024-10-07 (×3): qty 1
  Filled 2024-10-07: qty 2

## 2024-10-07 MED ORDER — TICAGRELOR 90 MG PO TABS: 90.0000 mg | ORAL_TABLET | Freq: Two times a day (BID) | ORAL | Status: DC

## 2024-10-07 MED ORDER — SENNA 8.6 MG PO TABS
1.0000 | ORAL_TABLET | Freq: Two times a day (BID) | ORAL | Status: DC
  Administered 2024-10-07: 8.6 mg
  Filled 2024-10-07: qty 1

## 2024-10-07 NOTE — Transfer of Care (Signed)
 Immediate Anesthesia Transfer of Care Note  Patient: Alice Lara  Procedure(s) Performed: RADIOLOGY WITH ANESTHESIA  Patient Location: ICU  Anesthesia Type:General  Level of Consciousness: sedated and Patient remains intubated per anesthesia plan  Airway & Oxygen Therapy: Patient remains intubated per anesthesia plan and Patient placed on Ventilator (see vital sign flow sheet for setting)  Post-op Assessment: Report given to RN and Post -op Vital signs reviewed and stable  Post vital signs: Reviewed and stable  Last Vitals:  Vitals Value Taken Time  BP 178/84 10/07/24 15:37  Temp    Pulse 86 10/07/24 15:44  Resp 16 10/07/24 15:44  SpO2 100 % 10/07/24 15:44  Vitals shown include unfiled device data.  Last Pain:  Vitals:   10/07/24 1149  TempSrc: Oral         Complications: No notable events documented.

## 2024-10-07 NOTE — Progress Notes (Signed)
STAT LTM EEG hooked up and running - no initial skin breakdown - push button tested - [facility] monitoring.MRI safe leads used

## 2024-10-07 NOTE — H&P (Addendum)
 "  NAME:  Alice Lara, MRN:  [id number], DOB:  [birth year], LOS: 0 ADMISSION DATE:  10/07/2024, CONSULTATION DATE:  10/07/24 REFERRING MD:  Dr. Michaela, CHIEF COMPLAINT:  post op L M2 thrombectomy, l carotid stent   History of Present Illness:  Alice Lara is a 69 y.o. female with PMH of HTN, tobacco use who presented to the ED today with reported syncopal episode and seizure like activity. Patient was reportedly at work today when she began feeling lightheaded, had apparent seizure like activity with postictal state for approximately 10 minutes. Patient then reportedly returned backed to her baseline, fully awake and oriented. While in the ED patient had a second witnessed seizure, loaded with keppra , and given 4mg  Ativan . It was noticed patient had new neurological deficits to include not moving the RUE. 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 ED discussed case with neurology, patient was given TNK, and taken to IR for thrombectomy and carotid artery stent placement.  Patient remains intubated, sedated on propofol  and fentanyl . cEEG monitoring ordered, with continuation of Keppra  500mg  BID. Repeat CTH ordered for tonight, and MRI tomorrow.   Pertinent  Medical History   has no past medical history on file.   Significant Hospital Events: Including procedures, antibiotic start and stop dates in addition to other pertinent events   2/3: PCCM consulted s/p TNK, left MCA M2 LVO s/p thrombectomy, left carotid artery stent  Interim History / Subjective:  On arrival to the unit patient was rolled, and had bleeding from right groin site. Manual pressure was held for 20 minutes, pulses intact, quick clot dressing applied. NIR aware.   Objective    Blood pressure (!) 173/79, pulse 85, temperature 97.7 F (36.5 C), temperature source Oral, resp. rate 16, height 5' 6 (1.676 m), weight 68 kg, SpO2 100%.    Vent Mode: PRVC FiO2 (%):  [40 %] 40  % Set Rate:  [16 bmp] 16 bmp Vt Set:  [470 mL] 470 mL PEEP:  [5 cmH20] 5 cmH20 Plateau Pressure:  [14 cmH20] 14 cmH20   Intake/Output Summary (Last 24 hours) at 10/07/2024 1629 Last data filed at 10/07/2024 1514 Gross per 24 hour  Intake 450 ml  Output --  Net 450 ml   Filed Weights   10/07/24 1149  Weight: 68 kg    Examination: Physical Exam Vitals and nursing note reviewed.  Constitutional:      Comments: Intubated and sedated   HENT:     Right Ear: External ear normal.     Left Ear: External ear normal.     Nose: Nose normal.     Mouth/Throat:     Pharynx: Oropharynx is clear.  Eyes:     Pupils: Pupils are equal, round, and reactive to light.  Cardiovascular:     Rate and Rhythm: Normal rate and regular rhythm.     Pulses: Normal pulses.     Heart sounds: Normal heart sounds.  Pulmonary:     Effort: Pulmonary effort is normal. No respiratory distress.     Breath sounds: No wheezing.     Comments: Intubated  Abdominal:     General: Abdomen is flat.     Palpations: Abdomen is soft.  Skin:    Findings: Bruising (right and left hand, right leg) present.     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 Patient returned to her baseline, however had another witnessed seizure while in the ED. CTH revealed left MCA M2 LVO, and was given TNK and taken for thrombectomy -Management per neurology -SBP goal <120-160  -Currently on cangrelor  until able to switch to PO antiplatelets, OG/NG tube ordered  -Loaded with Keppra  in the ED; continue keppra  500mg  BID -cEEG monitoring ordered  -CTH ordered for 1900; MRI ordered for tomorrow  -Follow up  echocardiogram, lipid panel, A1c% -Will need PT/OT/SLP when appropriate   Acute Respiratory Insufficiency 2/2 above  Intubated in IR -Sedated with propofol  and fentanyl  -Hold off on weaning sedation until cEEG evaluated by neurology  HTN -pta losartan 50mg  hydrochlorothiazide 12.5 mg daily   Reported Tobacco Use disorder   Labs   CBC: Recent Labs  Lab 10/07/24 1126  WBC 10.1  NEUTROABS 7.9*  HGB 13.1  HCT 38.6  MCV 90.0  PLT 280    Basic Metabolic Panel: Recent Labs  Lab 10/07/24 1126  NA 138  K 3.5  CL 100  CO2 22  GLUCOSE 134*  BUN 18  CREATININE 1.02*  CALCIUM  8.7*     Review of Systems:   See HPI  Past Medical History:  She,  has no past medical history on file.   Surgical History:   Past Surgical History:  Procedure Laterality Date   CESAREAN SECTION  1980 and 1986    x 2     Social History:   reports that she has been smoking cigarettes. She has never used smokeless tobacco. She reports current alcohol use. She reports that she does not use drugs.   Family History:  Her family history includes Aplastic anemia in her mother.   Allergies Allergies[1]   Home Medications  Prior to Admission medications  Medication Sig Start Date End Date Taking? Authorizing Provider  HYDROcodone -acetaminophen  (NORCO/VICODIN) 5-325 MG per tablet Take 1-2 tablets by mouth every 4 (four) hours as needed. Patient not taking: Reported on 05/16/2016 10/04/13   Eloy Dorothyann DEL, NP  ibuprofen (ADVIL,MOTRIN) 200 MG tablet Take 200 mg by mouth every 6 (six) hours as needed.    [provider]  losartan-hydrochlorothiazide (HYZAAR) 50-12.5 MG tablet Take 1 tablet by mouth daily.    [provider]  meloxicam  (MOBIC ) 7.5 MG tablet Take 1 tablet (7.5 mg total) by mouth 2 (two) times daily after a meal. 05/16/16   Kindl, Lynwood BIRCH, MD  Multiple Vitamins-Minerals (MULTI FOR HER 50+) TABS Take 1 tablet by mouth daily.    [provider]      Critical care time: 55 minutes     Talen Poser, PA-C [facility] For pager details, please see AMION or use Epic chat  After 1900, please call [facility] for cross coverage needs 10/07/2024, 4:29 PM      [1] No Known Allergies  "

## 2024-10-07 NOTE — ED Triage Notes (Signed)
 PT BIB [facility] from work at [employer] for sudden onset of LOC w/agonal breathing as described by bystanders.  Helped to the floor.  ON EMS arrival the pt appeared postictal for about 10 min then suddenly became A&O x4.  No seizure hx noted.    150 palp, 93% RA, 97% RA, HR 85 (sinus arrhythmia), CBG 137

## 2024-10-07 NOTE — ED Notes (Signed)
Back from CT with patient.

## 2024-10-07 NOTE — Brief Op Note (Signed)
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 2.  TICI 3 after embolectomy, left carotid angioplasty and stent placement 3.  Continue cangrelor  until oral antiplatelet therapy can be instituted   Nancyann LULLA Burns  @today @ 3:08 PM  "

## 2024-10-07 NOTE — Progress Notes (Signed)
 PHARMACIST CODE STROKE RESPONSE  Notified to mix TNK at 1357 by Dr. Michaela  TNK preparation completed at 1358  TNK dose = 17 mg IV over 5 seconds at 1400  Issues/delays encountered (if applicable):   Leonor GORMAN Bash 10/07/24 1:58 PM

## 2024-10-07 NOTE — Progress Notes (Signed)
 eLink Physician-Brief Progress Note Patient Name: Alice Lara DOB: 12/17/1955 MRN: [id number]   Date of Service  10/07/2024  HPI/Events of Note  69 year old female with left M2 occlusion s/p TNK and mechanical thrombectomy  CT head 10/07/24 7:07 PM  IMPRESSION: 1. Questionable new area of hypoattenuation in the posterolateral left temporal lobe, possibly representing an acute infarct, although evaluation is limited by streak artifact. 2. No edema or midline shift. 3. No acute intracranial hemorrhage.  Patient remains on vent support. EEG in place  eICU Interventions  Neuro team aware. MRI scheduled for 10/08/24 at 12 PM SBP goal 120-140 Orders to transition cangrelor  to Brilinta      Intervention Category Minor Interventions: Communication with other healthcare providers and/or family  Suhail Peloquin Slater Staff 10/07/2024, 9:38 PM

## 2024-10-07 NOTE — ED Notes (Signed)
 CT contacted for potential ETA and was asked could we bring or they would get her when they could.

## 2024-10-07 NOTE — Anesthesia Procedure Notes (Signed)
 Procedure Name: Intubation Date/Time: 10/07/2024 2:23 PM  Performed by: Cindie Donald CROME, CRNAPre-anesthesia Checklist: Patient identified, Emergency Drugs available, Suction available and Patient being monitored Patient Re-evaluated:Patient Re-evaluated prior to induction Oxygen Delivery Method: Circle System Utilized Preoxygenation: Pre-oxygenation with 100% oxygen Induction Type: IV induction Ventilation: Mask ventilation without difficulty Laryngoscope Size: Mac and 3 Grade View: Grade I Tube type: Oral Tube size: 7.0 mm Number of attempts: 1 Airway Equipment and Method: Stylet Placement Confirmation: ETT inserted through vocal cords under direct vision, positive ETCO2 and breath sounds checked- equal and bilateral Secured at: 21 cm Tube secured with: Tape Dental Injury: Teeth and Oropharynx as per pre-operative assessment  Comments: Patient before induction, masking, and intubation, was visualized as having blood on her tongue. Appears that patient may have previously bitten her tongue. Unrelated to induction and intubation.

## 2024-10-07 NOTE — ED Notes (Signed)
 During assessment patient became frustrated attempting to answer questions and fingerprinting continued to be stated. Patient confirmed she knew what she wanted to say but not coming out right. Patient denied numbness or tingling. Without facial droop. Patient did not feel like her speech was off and husband confirm speech and pattern normal. Patient continued to repeat I'm not right, something is wrong.  Attending Pickering came into room and evaluated changes.

## 2024-10-07 NOTE — Progress Notes (Signed)
 RT assisted with patient transport to CT and returned to 4N27 without complications.

## 2024-10-07 NOTE — Progress Notes (Signed)
 NeuroInterventional Radiology  Pre-Procedure Note  Dr. Michaela from Vascular Neurology and I have discussed this case.  We have evaluated the patient's symptoms, imaging findings, and what we know of the patient's condition before the onset of the current stroke event.  We agree the patient is an appropriate candidate for mechanical thrombectomy and that, despite the risks of thrombectomy, emergent thrombectomy surgery offers the best possibility of neurological improvement.

## 2024-10-07 NOTE — Anesthesia Postprocedure Evaluation (Signed)
"   Anesthesia Post Note  Patient: Alice Lara  Procedure(s) Performed: RADIOLOGY WITH ANESTHESIA     Patient location during evaluation: SICU Anesthesia Type: General Level of consciousness: sedated Pain management: pain level controlled Vital Signs Assessment: post-procedure vital signs reviewed and stable Respiratory status: patient remains intubated per anesthesia plan Cardiovascular status: stable Postop Assessment: no apparent nausea or vomiting Anesthetic complications: no   No notable events documented.  Last Vitals:  Vitals:   10/07/24 1410 10/07/24 1535  BP: (!) 173/79   Pulse:  85  Resp:  16  Temp:    SpO2:  100%    Last Pain:  Vitals:   10/07/24 1149  TempSrc: Oral                 Alice Lara      "

## 2024-10-07 NOTE — ED Notes (Signed)
 Neurology at bedside

## 2024-10-08 ENCOUNTER — Telehealth (HOSPITAL_COMMUNITY): Payer: Self-pay | Admitting: Pharmacy Technician

## 2024-10-08 ENCOUNTER — Encounter (HOSPITAL_COMMUNITY): Payer: Self-pay | Admitting: Neuroradiology

## 2024-10-08 ENCOUNTER — Inpatient Hospital Stay (HOSPITAL_COMMUNITY)

## 2024-10-08 ENCOUNTER — Other Ambulatory Visit (HOSPITAL_COMMUNITY): Payer: Self-pay

## 2024-10-08 DIAGNOSIS — I6389 Other cerebral infarction: Secondary | ICD-10-CM

## 2024-10-08 DIAGNOSIS — R569 Unspecified convulsions: Secondary | ICD-10-CM | POA: Diagnosis not present

## 2024-10-08 DIAGNOSIS — D509 Iron deficiency anemia, unspecified: Secondary | ICD-10-CM | POA: Diagnosis not present

## 2024-10-08 DIAGNOSIS — I63519 Cerebral infarction due to unspecified occlusion or stenosis of unspecified middle cerebral artery: Secondary | ICD-10-CM | POA: Diagnosis not present

## 2024-10-08 DIAGNOSIS — R220 Localized swelling, mass and lump, head: Secondary | ICD-10-CM | POA: Diagnosis not present

## 2024-10-08 DIAGNOSIS — I724 Aneurysm of artery of lower extremity: Secondary | ICD-10-CM | POA: Diagnosis not present

## 2024-10-08 DIAGNOSIS — J9600 Acute respiratory failure, unspecified whether with hypoxia or hypercapnia: Secondary | ICD-10-CM | POA: Diagnosis not present

## 2024-10-08 DIAGNOSIS — J9601 Acute respiratory failure with hypoxia: Secondary | ICD-10-CM

## 2024-10-08 DIAGNOSIS — I6523 Occlusion and stenosis of bilateral carotid arteries: Secondary | ICD-10-CM | POA: Diagnosis not present

## 2024-10-08 DIAGNOSIS — D649 Anemia, unspecified: Secondary | ICD-10-CM | POA: Diagnosis not present

## 2024-10-08 DIAGNOSIS — I10 Essential (primary) hypertension: Secondary | ICD-10-CM | POA: Diagnosis not present

## 2024-10-08 LAB — CBC
HCT: 32.8 % — ABNORMAL LOW (ref 36.0–46.0)
Hemoglobin: 10.9 g/dL — ABNORMAL LOW (ref 12.0–15.0)
MCH: 30.1 pg (ref 26.0–34.0)
MCHC: 33.2 g/dL (ref 30.0–36.0)
MCV: 90.6 fL (ref 80.0–100.0)
Platelets: 283 10*3/uL (ref 150–400)
RBC: 3.62 MIL/uL — ABNORMAL LOW (ref 3.87–5.11)
RDW: 13 % (ref 11.5–15.5)
WBC: 15.1 10*3/uL — ABNORMAL HIGH (ref 4.0–10.5)
nRBC: 0 % (ref 0.0–0.2)

## 2024-10-08 LAB — COMPREHENSIVE METABOLIC PANEL WITH GFR
ALT: 10 U/L (ref 0–44)
AST: 22 U/L (ref 15–41)
Albumin: 3.8 g/dL (ref 3.5–5.0)
Alkaline Phosphatase: 60 U/L (ref 38–126)
Anion gap: 12 (ref 5–15)
BUN: 14 mg/dL (ref 8–23)
CO2: 22 mmol/L (ref 22–32)
Calcium: 8.7 mg/dL — ABNORMAL LOW (ref 8.9–10.3)
Chloride: 102 mmol/L (ref 98–111)
Creatinine, Ser: 0.85 mg/dL (ref 0.44–1.00)
GFR, Estimated: >60 mL/min
Glucose, Bld: 146 mg/dL — ABNORMAL HIGH (ref 70–99)
Potassium: 4.3 mmol/L (ref 3.5–5.1)
Sodium: 136 mmol/L (ref 135–145)
Total Bilirubin: 0.2 mg/dL (ref 0.0–1.2)
Total Protein: 5.9 g/dL — ABNORMAL LOW (ref 6.5–8.1)

## 2024-10-08 LAB — ECHOCARDIOGRAM COMPLETE
Area-P 1/2: 3.89 cm2
Height: 66 in
S' Lateral: 3.3 cm
Weight: 2400 [oz_av]

## 2024-10-08 LAB — LIPID PANEL
Cholesterol: 213 mg/dL — ABNORMAL HIGH (ref 0–200)
HDL: 59 mg/dL
LDL Cholesterol: 139 mg/dL — ABNORMAL HIGH (ref 0–99)
Total CHOL/HDL Ratio: 3.6 ratio
Triglycerides: 75 mg/dL
VLDL: 15 mg/dL (ref 0–40)

## 2024-10-08 LAB — GLUCOSE, CAPILLARY
Glucose-Capillary: 106 mg/dL — ABNORMAL HIGH (ref 70–99)
Glucose-Capillary: 120 mg/dL — ABNORMAL HIGH (ref 70–99)
Glucose-Capillary: 126 mg/dL — ABNORMAL HIGH (ref 70–99)
Glucose-Capillary: 140 mg/dL — ABNORMAL HIGH (ref 70–99)
Glucose-Capillary: 159 mg/dL — ABNORMAL HIGH (ref 70–99)
Glucose-Capillary: 171 mg/dL — ABNORMAL HIGH (ref 70–99)

## 2024-10-08 MED ORDER — DEXMEDETOMIDINE HCL IN NACL 400 MCG/100ML IV SOLN
0.0000 ug/kg/h | INTRAVENOUS | Status: DC
  Administered 2024-10-08: 1.2 ug/kg/h via INTRAVENOUS
  Administered 2024-10-08: 0.4 ug/kg/h via INTRAVENOUS
  Administered 2024-10-09: 1.2 ug/kg/h via INTRAVENOUS
  Administered 2024-10-09: 1.1 ug/kg/h via INTRAVENOUS
  Filled 2024-10-08 (×4): qty 100

## 2024-10-08 MED ORDER — PHENYLEPHRINE 80 MCG/ML (10ML) SYRINGE FOR IV PUSH (FOR BLOOD PRESSURE SUPPORT)
PREFILLED_SYRINGE | INTRAVENOUS | Status: AC
  Filled 2024-10-08: qty 10

## 2024-10-08 MED ORDER — SENNA 8.6 MG PO TABS
1.0000 | ORAL_TABLET | Freq: Two times a day (BID) | ORAL | Status: AC
  Administered 2024-10-08 – 2024-10-10 (×5): 8.6 mg
  Filled 2024-10-08 (×5): qty 1

## 2024-10-08 MED ORDER — LACTATED RINGERS IV BOLUS
500.0000 mL | Freq: Once | INTRAVENOUS | Status: AC
  Administered 2024-10-08: 500 mL via INTRAVENOUS

## 2024-10-08 MED ORDER — FENTANYL CITRATE (PF) 50 MCG/ML IJ SOSY
50.0000 ug | PREFILLED_SYRINGE | INTRAMUSCULAR | Status: DC | PRN
  Administered 2024-10-08 (×2): 50 ug via INTRAVENOUS

## 2024-10-08 MED ORDER — HALOPERIDOL LACTATE 5 MG/ML IJ SOLN
INTRAMUSCULAR | Status: AC
  Administered 2024-10-08: 5 mg
  Filled 2024-10-08: qty 1

## 2024-10-08 MED ORDER — HALOPERIDOL LACTATE 5 MG/ML IJ SOLN
5.0000 mg | Freq: Four times a day (QID) | INTRAMUSCULAR | Status: DC | PRN
  Administered 2024-10-09 (×2): 5 mg via INTRAVENOUS
  Filled 2024-10-08 (×2): qty 1

## 2024-10-08 MED ORDER — ATORVASTATIN CALCIUM 40 MG PO TABS
40.0000 mg | ORAL_TABLET | Freq: Every day | ORAL | Status: AC
  Administered 2024-10-08 – 2024-10-10 (×3): 40 mg
  Filled 2024-10-08 (×3): qty 1

## 2024-10-08 MED ORDER — POLYETHYLENE GLYCOL 3350 17 G PO PACK
17.0000 g | PACK | Freq: Every day | ORAL | Status: AC
  Administered 2024-10-08 – 2024-10-09 (×2): 17 g
  Filled 2024-10-08 (×2): qty 1

## 2024-10-08 MED ORDER — PERFLUTREN LIPID MICROSPHERE
1.0000 mL | INTRAVENOUS | Status: AC | PRN
  Administered 2024-10-08: 4 mL via INTRAVENOUS

## 2024-10-08 MED ORDER — LORAZEPAM 2 MG/ML IJ SOLN
INTRAMUSCULAR | Status: AC
  Administered 2024-10-08: 2 mg
  Filled 2024-10-08: qty 1

## 2024-10-08 MED ORDER — ORAL CARE MOUTH RINSE
15.0000 mL | OROMUCOSAL | Status: AC
  Administered 2024-10-09 – 2024-10-10 (×7): 15 mL via OROMUCOSAL

## 2024-10-08 MED ORDER — FENTANYL CITRATE (PF) 50 MCG/ML IJ SOSY
50.0000 ug | PREFILLED_SYRINGE | INTRAMUSCULAR | Status: DC | PRN
  Administered 2024-10-08 (×4): 50 ug via INTRAVENOUS
  Filled 2024-10-08 (×3): qty 1
  Filled 2024-10-08: qty 4
  Filled 2024-10-08: qty 1

## 2024-10-08 MED ORDER — EPINEPHRINE 1 MG/10ML IV SOSY
PREFILLED_SYRINGE | INTRAVENOUS | Status: AC
  Filled 2024-10-08: qty 10

## 2024-10-08 MED ORDER — ORAL CARE MOUTH RINSE: 15.0000 mL | OROMUCOSAL | Status: AC | PRN

## 2024-10-08 MED ORDER — DEXMEDETOMIDINE HCL IN NACL 400 MCG/100ML IV SOLN
INTRAVENOUS | Status: AC
  Administered 2024-10-08: 0.4 ug
  Filled 2024-10-08: qty 100

## 2024-10-08 NOTE — Progress Notes (Signed)
 VENTILATOR WEAN NOTE 10/08/2024  Start Mode: PRVC  Wean Mode: Pressure Support  Duration before failure: No wean   Reason for failure: No wean   Notes: No wean at this time. Patient is on continous EEG, pending MRI.

## 2024-10-08 NOTE — Procedures (Signed)
 Patient Name: Alice Lara  MRN: [id number]  Epilepsy Attending: Arlin MALVA Krebs  Referring Physician/Provider: Antonetta Vina NOVAK, NP  Duration: 10/07/2024 1737 to 10/08/2024 1845  Patient history: 69yo F with new onset seizure. EEG to evaluate for seizure.  Level of alertness: comatose/ lethargic   AEDs during EEG study: LEV, Propofol   Technical aspects: This EEG study was done with scalp electrodes positioned according to the 10-20 International system of electrode placement. Electrical activity was reviewed with band pass filter of 1-70Hz , sensitivity of 7 uV/mm, display speed of 47mm/sec with a 60Hz  notched filter applied as appropriate. EEG data were recorded continuously and digitally stored.  Video monitoring was available and reviewed as appropriate.  Description: EEG initially showed near continuous generalized 3 to 6 Hz theta-delta slowing admixed with 15 to 18 Hz beta activity distributed symmetrically and diffusely.  As sedation was weaned, EEG gradually improved and showed posterior dominant rhythm of 8-9 Hz activity of moderate voltage (25-35 uV) seen predominantly in posterior head regions, symmetric and reactive to eye opening and eye closing. Sleep was characterized by vertex waves, sleep spindles (12 to 14 Hz), maximal frontocentral region. EEG also showed intermittent generalized 3 to 6 Hz theta slowing.  Sharply contoured waves were noted in left parieto-occipital region.  Hyperventilation and photic stimulation were not performed.     EEG was disconnected between 10/08/2024 1132 to 1244 for testing  ABNORMALITY - Continuous slow, generalized  IMPRESSION: This study was initially suggestive of generalized cerebral dysfunction ( encephalopathy) nonspecific etiology but likely related to sedation. Gradually as sedation was weaned, EEG improved and was suggestive mild diffuse encephalopathy.  No seizures or definite epileptiform discharges were seen throughout the  recording.  [name]

## 2024-10-08 NOTE — Progress Notes (Incomplete)
 2/4 0300 RN reached out to eLink about patient being bradycardic in the 40's-50's since 1945 when previously during the day was NSR 70's-80's. Fentanyl  drip was started approximately 30 minutes prior to bradycardia. Pt is also requiring 9 mcg/min of Levophed  through a peripheral IV.   2/4 0325: eLink provider Kassie, MD on screen for remote patient evaluation. All sedation stopped and new orders received

## 2024-10-08 NOTE — Progress Notes (Addendum)
 Right groin pseudoaneurysm evaluation and left carotid artery doppler has been completed.  Results can be found in chart review under CV Proc.  10/08/2024 11:59 AM  Jonica Bickhart Elden Appl, RVT.

## 2024-10-08 NOTE — Progress Notes (Addendum)
 "  NAME:  Alice Lara, MRN:  [id number], DOB:  1956/03/27, LOS: 1 ADMISSION DATE:  10/07/2024, CONSULTATION DATE:  10/07/24 REFERRING MD:  Dr. Michaela, CHIEF COMPLAINT:  post op L M2 thrombectomy, l carotid stent   History of Present Illness:  Alice Lara is a 69 y.o. female with PMH of HTN, tobacco use who presented to the ED today with reported syncopal episode and seizure like activity. Patient was reportedly at work today when she began feeling lightheaded, had apparent seizure like activity with postictal state for approximately 10 minutes. Patient then reportedly returned backed to her baseline, fully awake and oriented. While in the ED patient had a second witnessed seizure, loaded with keppra , and given 4mg  Ativan . It was noticed patient had new neurological deficits to include not moving the RUE. 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 ED discussed case with neurology, patient was given TNK, and taken to IR for thrombectomy and carotid artery stent placement.  Patient remains intubated, sedated on propofol  and fentanyl . cEEG monitoring ordered, with continuation of Keppra  500mg  BID. Repeat CTH ordered for tonight, and MRI tomorrow.   Pertinent  Medical History   has no past medical history on file.   Significant Hospital Events: Including procedures, antibiotic start and stop dates in addition to other pertinent events   2/3: PCCM consulted s/p TNK, left MCA M2 LVO s/p thrombectomy, left carotid artery stent  Interim History / Subjective:  Patient remains on ventilator, receiving fentanyl  prn. Patient does move extremities, RUE slower to respond.   Objective    Blood pressure (!) 159/61, pulse 65, temperature 98.3 F (36.8 C), temperature source Axillary, resp. rate 20, height 5' 6 (1.676 m), weight 68 kg, SpO2 100%.    Vent Mode: PRVC FiO2 (%):  [40 %] 40 % Set Rate:  [16 bmp-20 bmp] 20 bmp Vt Set:  [470 mL] 470  mL PEEP:  [5 cmH20] 5 cmH20 Plateau Pressure:  [14 cmH20-20 cmH20] 16 cmH20   Intake/Output Summary (Last 24 hours) at 10/08/2024 1123 Last data filed at 10/08/2024 0733 Gross per 24 hour  Intake 3019.05 ml  Output 1060 ml  Net 1959.05 ml   Filed Weights   10/07/24 1149  Weight: 68 kg   Physical Exam Vitals and nursing note reviewed.  Constitutional:      Comments: On ventilator, some sedation but moving extremities  HENT:     Head: Normocephalic.     Right Ear: External ear normal.     Left Ear: External ear normal.     Mouth/Throat:     Comments: Tongue swelling, improved from admission Cardiovascular:     Rate and Rhythm: Regular rhythm. Bradycardia present.     Comments: HR 50s Pulmonary:     Comments: ventilator Abdominal:     General: Abdomen is flat.     Palpations: Abdomen is soft.  Musculoskeletal:     Right lower leg: No edema.     Left lower leg: No edema.  Skin:    Findings: Bruising present.  Neurological:     Comments: On ventilator, some fentanyl  for sedation, does not open eyes but moving extremities with RUE slower to respond.         Resolved problem list   Assessment and Plan   New Onset Seizure Left MCA M2 LVO s/p TNK and Thrombectomy 2/3 Left Internal Carotid Artery Occlusion s/p stent 2/3 Patient presented to ED with syncopal episode/seizure  like activity after feeling lightheaded. Patient returned to her baseline, however had another witnessed seizure while in the ED. CTH revealed left MCA M2 LVO, and was given TNK and taken for thrombectomy -Management per neurology -SBP goal 120-160 -Continue keppra  500mg  BID, Aspirin , Brilinta   -cEEG monitoring overnight without seizure activity, mild diffuse encephalopathy  -MRI and MRA ordered for today -Will need PT/OT/SLP when appropriate   Acute Respiratory Insufficiency 2/2 above  Intubated in IR -Receiving fentanyl , propofol  for MRI -Will re-eval after MRI for SBT  Tongue  Swelling -suspected 2/2 bite during seizure with active bleeding, possibly exacerbated with TNK. -Given decadron  and benadryl  2/3  Acute normocytic anemia -hgb 10.9, from 13 on admission suspect 2/2 bleeding from tongue and r femoral access site post TNK  HTN -pta losartan 50mg  hydrochlorothiazide 12.5 mg daily   Reported Tobacco Use disorder   Critical care time: 40 minutes     Alice Loschiavo, PA-C [facility] For pager details, please see AMION or use Epic chat  After 1900, please call [facility] for cross coverage needs 10/08/2024, 11:23 AM    "

## 2024-10-08 NOTE — Telephone Encounter (Signed)
 Patient Product/process Development Scientist completed.    The patient is insured through [employer] Patient has [employer], may use a copay card, and/or apply for patient assistance if available.    Ran test claim for ticagrelor  (Brilinta ) 90 mg and the current 30 day co-pay is $50.00.   This test claim was processed through [facility]- copay amounts may vary at other pharmacies due to pharmacy/plan contracts, or as the patient moves through the different stages of their insurance plan.     Reyes Sharps, CPHT Pharmacy Technician Patient Advocate Specialist Lead [facility] Patient Advocate Team Direct Number: [phone]  Fax: [phone]

## 2024-10-08 NOTE — Procedures (Signed)
 Extubation Procedure Note  Patient Details:   Name: Alice Lara DOB: June 24, 1956 MRN: [id number]   Airway Documentation:    Vent end date: 10/08/24 Vent end time: 1532   Evaluation  O2 sats: stable throughout Complications: No apparent complications Patient did tolerate procedure well. Bilateral Breath Sounds: Rhonchi   No  Patient was extubated to a 4L [place] with several RN's & CCM team at the bedside. Positive cuff leak noted prior to extubation per CCM.   [name] 10/08/2024, 3:32 PM

## 2024-10-08 NOTE — Progress Notes (Signed)
 Echocardiogram 2D Echocardiogram has been performed.  Juliene JINNY Rucks 10/08/2024, 9:32 AM

## 2024-10-08 NOTE — Progress Notes (Signed)
 LTM maint complete - no skin breakdown under: fp1,fp2,grnd

## 2024-10-08 NOTE — Progress Notes (Addendum)
 eLink Physician-Brief Progress Note Patient Name: OPHA MCGHEE DOB: [birth year] MRN: [id number]   Date of Service  10/08/2024  HPI/Events of Note  Bradycardiac to 40s since shift change.  Levophed  increased from 6>8 Propfol and fentanyl  have been started since shift change as well but remains at low doses, currently fentanyl  25 and prop 20  Neuro exam stable  eICU Interventions  Hold gtts. PRN fentanyl  only LR bolus 500 cc ordered for BP as well     Intervention Category Intermediate Interventions: Hypotension - evaluation and management  Desree Leap Slater Staff 10/08/2024, 3:25 AM

## 2024-10-08 NOTE — Progress Notes (Signed)
 She remains intubated but opens her eyes.  She is moving all 4 extremities spontaneously.  The right arm seems a little weaker than the left.  Normal movement of the legs.  She is lifting her head off the bed.  There is bruising in the femoral region.  No palpable aneurysm.  The ultrasound demonstrates a small, subcentimeter pseudoaneurysm.  I reviewed her carotid ultrasound which shows the left carotid stent is patent.  Aspirin  and ticagrelor  were started yesterday and she has received a dose this morning.  MRI is pending.  Assessment: Left hemispheric stroke due to atherosclerotic occlusion of the left internal carotid artery and intracranial embolism day 1 after left carotid stent embolectomy.  Continue aspirin  and ticagrelor .  Follow-up right femoral ultrasound in 48 hours.

## 2024-10-08 NOTE — Progress Notes (Addendum)
 PCCM interval progress note:  MRI/ MRA unable to be obtained due to patient moving around, requiring additional sedation to include propofol  along with fentanyl  pushes, after overnight fentanyl  and propofol  infusions were held due to bradycardia and hypotension. Patient becomes hypotensive with fentanyl  pushes, and is now on increasing levophed  requirements. Will attempt to wean off sedation to assess for possible extubation this evening.   Helon Wisinski, PA-C [facility] For pager details, please see AMION or use Epic chat  After 1900, please call [facility] for cross coverage needs 10/08/2024, 1:54 PM

## 2024-10-08 NOTE — Progress Notes (Addendum)
 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  She has significant tongue swelling and did receive Benadryl  and steroids yesterday.  Will not attempt extubation today due to tongue swelling.  OBJECTIVE  CBC    Component Value Date/Time   WBC 15.1 (H) 10/08/2024 0605   RBC 3.62 (L) 10/08/2024 0605   HGB 10.9 (L) 10/08/2024 0605   HCT 32.8 (L) 10/08/2024 0605   PLT 283 10/08/2024 0605   MCV 90.6 10/08/2024 0605   MCH 30.1 10/08/2024 0605   MCHC 33.2 10/08/2024 0605   RDW 13.0 10/08/2024 0605   LYMPHSABS 1.6 10/07/2024 1126   MONOABS 0.5 10/07/2024 1126   EOSABS 0.1 10/07/2024 1126   BASOSABS 0.1 10/07/2024 1126    BMET    Component Value Date/Time   NA 136 10/08/2024 0605   K 4.3 10/08/2024 0605   CL 102 10/08/2024 0605   CO2 22 10/08/2024 0605   GLUCOSE 146 (H) 10/08/2024 0605   BUN 14 10/08/2024 0605   CREATININE 0.85 10/08/2024 0605   CALCIUM  8.7 (L) 10/08/2024 0605   GFRNONAA >60 10/08/2024 0605    IMAGING past 24 hours VAS US  GROIN PSEUDOANEURYSM Result Date: 10/08/2024  ARTERIAL PSEUDOANEURYSM  Patient Name:  Alice Lara  Date of Exam:   10/08/2024 Medical Rec #: [id number]       Accession #:    [id number] Date of Birth: 1956/09/02       Patient Gender: F Patient Age:   69 years Exam Location:  [facility] Procedure:      VAS US  QUILLIAN CAPES Referring Phys: VINA PESA --------------------------------------------------------------------------------  Exam: Right groin Indications: Patient complains of groin pain, bruising and palpable knot. History: Rule out pseudoanurysm at right groin access site. Comparison Study: No prior exam. Performing Technologist: Edilia Elden Appl  Examination Guidelines: A complete  evaluation includes B-mode imaging, spectral Doppler, color Doppler, and power Doppler as needed of all accessible portions of each vessel. Bilateral testing is considered an integral part of a complete examination. Limited examinations for reoccurring indications may be performed as noted. +------------+----------+----------+------+----------+ Right DuplexPSV (cm/s) Waveform PlaqueComment(s) +------------+----------+----------+------+----------+ CFA            181    monophasic                 +------------+----------+----------+------+----------+ PFA            269    monophasic                 +------------+----------+----------+------+----------+ Prox SFA       196    monophasic                 +------------+----------+----------+------+----------+  Findings: An area with well defined borders measuring 1.3 cm x 1.1 cm was visualized arising off of the common femoral artery with ultrasound characteristics of a pseudoaneurysm. The neck measures approximately 0.2 cm wide and 0.3 cm long. Bilobed pseudoaneurysm noted in the right groin, largest measured.  Summary: Bilobed pseudoaneurysm with the largest measuring 1.34 x 1.08 cm.     --------------------------------------------------------------------------------    Preliminary    VAS US  CAROTID Result Date: 10/08/2024 Carotid Arterial Duplex Study Patient Name:  Alice Lara  Date of Exam:   10/08/2024 Medical Rec #: [id number]       Accession #:  [id number] Date of Birth: 21-Aug-1956       Patient Gender: F Patient Age:   69 years Exam Location:  [facility] Procedure:      VAS US  CAROTID Referring Phys: NANCYANN HECK --------------------------------------------------------------------------------  Indications:       CVA, Left stent and Post-op. Limitations        Today's exam was limited due to the post surgical status of                    the patient, the patient's inability or unwillingness to                    cooperate, patient  on a ventilator and assisted by RN due to                    patient's constant movements. Comparison Study:  No prior exam. Performing Technologist: Edilia Elden Appl  Examination Guidelines: A complete evaluation includes B-mode imaging, spectral Doppler, color Doppler, and power Doppler as needed of all accessible portions of each vessel. Bilateral testing is considered an integral part of a complete examination. Limited examinations for reoccurring indications may be performed as noted.  Left Carotid Findings: +----------+--------+--------+--------+------------------+--------+           PSV cm/sEDV cm/sStenosisPlaque DescriptionComments +----------+--------+--------+--------+------------------+--------+ CCA Prox  122     42                                         +----------+--------+--------+--------+------------------+--------+ ICA Mid   241     69      60-79%                             +----------+--------+--------+--------+------------------+--------+ ICA Distal128     48      40-59%                             +----------+--------+--------+--------+------------------+--------+ ECA       259     27                                         +----------+--------+--------+--------+------------------+--------+ +----------+--------+--------+----------------+-------------------+           PSV cm/sEDV cm/sDescribe        Arm Pressure (mmHG) +----------+--------+--------+----------------+-------------------+ [id number]     26      Multiphasic, WNL                    +----------+--------+--------+----------------+-------------------+ +---------+--------+--+--------+--+---------+ VertebralPSV cm/s65EDV cm/s14Antegrade +---------+--------+--+--------+--+---------+  Left Stent(s): +---------------+---+--++++ Prox to Stent  13843 +---------------+---+--++++ Proximal Stent 14339 +---------------+---+--++++ Mid Stent      32594  +---------------+---+--++++ Distal Stent   35390 +---------------+---+--++++ Distal to [id number] +---------------+---+--++++    Summary:  Left Carotid: Velocities in the left ICA are consistent with a 60-79% stenosis.               The ECA appears <50% stenosed. High grade stenosis noted in the               left middle and distal stent. Vertebrals:  Left vertebral artery demonstrates antegrade flow. Subclavians: Normal flow hemodynamics were seen in the left subclavian artery. *See table(s)  above for measurements and observations.     Preliminary    Overnight EEG with video Result Date: 10/08/2024 Shelton Arlin KIDD, MD     10/08/2024 10:00 AM Patient Name: SALINDA SNEDEKER MRN: [id number] Epilepsy Attending: Arlin KIDD Shelton Referring Physician/Provider: Antonetta Vina NOVAK, NP Duration: 10/07/2024 1737 to 10/08/2024 1000 Patient history: 69yo F with new onset seizure. EEG to evaluate for seizure. Level of alertness: comatose/ lethargic AEDs during EEG study: LEV, Propofol  Technical aspects: This EEG study was done with scalp electrodes positioned according to the 10-20 International system of electrode placement. Electrical activity was reviewed with band pass filter of 1-70Hz , sensitivity of 7 uV/mm, display speed of 61mm/sec with a 60Hz  notched filter applied as appropriate. EEG data were recorded continuously and digitally stored.  Video monitoring was available and reviewed as appropriate. Description: EEG initially showed near continuous generalized 3 to 6 Hz theta-delta slowing admixed with 15 to 18 Hz beta activity distributed symmetrically and diffusely.  As sedation was weaned, EEG gradually improved and showed posterior dominant rhythm of 8-9 Hz activity of moderate voltage (25-35 uV) seen predominantly in posterior head regions, symmetric and reactive to eye opening and eye closing. Sleep was characterized by vertex waves, sleep spindles (12 to 14 Hz), maximal frontocentral region.  EEG also showed  intermittent generalized 3 to 6 Hz theta slowing.  Hyperventilation and photic stimulation were not performed.   ABNORMALITY - Continuous slow, generalized IMPRESSION: This study was initially suggestive of generalized cerebral dysfunction ( encephalopathy) nonspecific etiology but likely related to sedation.  Gradually as sedation was weaned, EEG improved and was suggestive mild diffuse encephalopathy.  No seizures or epileptiform discharges were seen throughout the recording. 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 PRE-PROCEDURE: Maximal Sterile Barrier Technique was utilized including caps, mask, sterile gowns, sterile gloves, sterile drape, hand hygiene and skin antiseptic. A timeout was performed prior to the initiation of the procedure. PROCEDURE: Femoral access was obtained with ultrasound using direct visualization of the needle puncture into the artery. A 8 French sheath was placed. The 90 cm 6 French shuttle was advanced over a VT K catheter into the left common carotid artery. An arteriogram was done which showed recanalization of the left internal carotid artery from the TNK, but a 99% stenosis with adherent thrombus. A carotid terminus embolus was also noted. The lesion was crossed with a synchro support wire and angioplastied with a 4 mm balloon. The Shuttle was advanced over the balloon such that there was no flow reestablished in the internal carotid artery. The Shuttle was then aspirated. The red 62 catheter was advanced over  a velocity catheter and synchro support wire beyond the occlusion into the left M2. The 4 mm solitaire was then deployed. The aspiration catheter was then advanced to engage the thrombus and the stent retriever and aspiration catheter were then withdrawn together. This resulted in removal of the large thrombus. Additional thrombus was aspirated through the shuttle sheath. A follow-up arteriogram now demonstrated complete reperfusion intracranially. The shuttle was then withdrawn proximal to the internal carotid artery under aspiration. An arteriogram was performed of the left common carotid  artery with cervical and cerebral imaging to assess the result of the thrombectomy, but also to assess the residual internal carotid artery lesion. This demonstrated that the intracranial vessels remained patent, but there was a greater than 90% stenosis of the left internal carotid artery. Due to concern for rethrombosis of the artery, the decision was made to proceed with stent placement. The severe carotid lesion was wired with the synchro support wire. A 8 x 6 x 40 mm Xact stent was advanced across the lesion and deployed. No post dilatation. A final follow-up arteriogram was done which confirmed patency of the stent without acute thrombus formation. The intracranial vessels remain patent with normal flow in the left anterior, middle and posterior cerebral artery territories. FINDINGS: Left carotid terminus and middle cerebral artery embolus with 99% left internal carotid artery stenosis. TICI 3 following intervention. 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 TICI 3 following intervention of intracranial embolectomy and left carotid stent placement Codes: 38354, 23062, 62783 Electronically Signed   By: Nancyann Burns M.D.   On: 10/08/2024 09:29   IR US  Guide Vasc Access Right Result Date: 10/08/2024 INDICATION: Acute Large Vessel occlusion, left  internal carotid artery and left middle cerebral artery occlusion EXAM: Mechanical thrombectomy, left carotid stent MEDICATIONS: No additional ANESTHESIA/SEDATION: General CONTRAST:  60mL OMNIPAQUE  IOHEXOL  300 MG/ML  SOLN FLUOROSCOPY: Radiation Exposure Index (as provided by the fluoroscopic device): 231 MGy reference air Kerma COMPLICATIONS: None immediate. PRE-PROCEDURE: Maximal Sterile Barrier Technique was utilized including caps, mask, sterile gowns, sterile gloves, sterile drape, hand hygiene and skin antiseptic. A timeout was performed prior to the initiation of the procedure. PROCEDURE: Femoral access was obtained with ultrasound using direct visualization of the needle puncture into the artery. A 8 French sheath was placed. The 90 cm 6 French shuttle was advanced over a VT K catheter into the left common carotid artery. An arteriogram was done which showed recanalization of the left internal carotid artery from the TNK, but a 99% stenosis with adherent thrombus. A carotid terminus embolus was also noted. The lesion was crossed with a synchro support wire and angioplastied with a 4 mm balloon. The Shuttle was advanced over the balloon such that there was no flow reestablished in the internal carotid artery. The Shuttle was then aspirated. The red 62 catheter was advanced over a velocity catheter and synchro support wire beyond the occlusion into the left M2. The 4 mm solitaire was then deployed. The aspiration catheter was then advanced to engage the thrombus and the stent retriever and aspiration catheter were then withdrawn together. This resulted in removal of the large thrombus. Additional thrombus was aspirated through the shuttle sheath. A follow-up arteriogram now demonstrated complete reperfusion intracranially. The shuttle was then withdrawn proximal to the internal carotid artery under aspiration. An arteriogram was performed of the left common carotid artery with cervical and cerebral imaging to  assess the result of the thrombectomy, but also to assess the residual internal carotid artery lesion. This demonstrated that the intracranial vessels remained patent, but there was a greater than 90% stenosis of the left internal carotid artery. Due to concern for rethrombosis of the artery, the decision was made to proceed with stent placement. The severe carotid lesion was wired with the synchro support wire. A 8 x 6 x 40 mm Xact stent was advanced across the lesion and deployed. No post dilatation. A final follow-up arteriogram was done which confirmed patency of the  stent without acute thrombus formation. The intracranial vessels remain patent with normal flow in the left anterior, middle and posterior cerebral artery territories. FINDINGS: Left carotid terminus and middle cerebral artery embolus with 99% left internal carotid artery stenosis. TICI 3 following intervention. 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 TICI 3 following intervention of intracranial embolectomy and left carotid stent placement Codes: 38354, 23062, 62783 Electronically Signed   By: Nancyann Burns M.D.   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 PRE-PROCEDURE: Maximal Sterile Barrier Technique was utilized including caps, mask, sterile gowns, sterile gloves, sterile drape, hand hygiene and skin antiseptic. A timeout was performed prior to the initiation of the procedure. PROCEDURE: Femoral access was  obtained with ultrasound using direct visualization of the needle puncture into the artery. A 8 French sheath was placed. The 90 cm 6 French shuttle was advanced over a VT K catheter into the left common carotid artery. An arteriogram was done which showed recanalization of the left internal carotid artery from the TNK, but a 99% stenosis with adherent thrombus. A carotid terminus embolus was also noted. The lesion was crossed with a synchro support wire and angioplastied with a 4 mm balloon. The Shuttle was advanced over the balloon such that there was no flow reestablished in the internal carotid artery. The Shuttle was then aspirated. The red 62 catheter was advanced over a velocity catheter and synchro support wire beyond the occlusion into the left M2. The 4 mm solitaire was then deployed. The aspiration catheter was then advanced to engage the thrombus and the stent retriever and aspiration catheter were then withdrawn together. This resulted in removal of the large thrombus. Additional thrombus was aspirated through the shuttle sheath. A follow-up arteriogram now demonstrated complete reperfusion intracranially. The shuttle was then withdrawn proximal to the internal carotid artery under aspiration. An arteriogram was performed of the left common carotid artery with cervical and cerebral imaging to assess the result of the thrombectomy, but also to assess the residual internal carotid artery lesion. This demonstrated that the intracranial vessels remained patent, but there was a greater than 90% stenosis of the left internal carotid artery. Due to concern for rethrombosis of the artery, the decision was made to proceed with stent placement. The severe carotid lesion was wired with the synchro support wire. A 8 x 6 x 40 mm Xact stent was advanced across the lesion and deployed. No post dilatation. A final follow-up arteriogram was done which confirmed patency of the stent without acute thrombus formation. The  intracranial vessels remain patent with normal flow in the left anterior, middle and posterior cerebral artery territories. FINDINGS: Left carotid terminus and middle cerebral artery embolus with 99% left internal carotid artery stenosis. TICI 3 following intervention. 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 TICI 3 following intervention of intracranial embolectomy and left carotid stent placement Codes: 38354, 23062, 62783 Electronically Signed   By: Nancyann Burns M.D.   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 PRE-PROCEDURE: Maximal Sterile Barrier Technique was utilized including caps, mask, sterile gowns, sterile gloves, sterile drape, hand hygiene and skin antiseptic. A timeout was performed prior to the initiation of the procedure. PROCEDURE: Femoral access was obtained with ultrasound using direct visualization of the needle puncture into the artery. A 8 French sheath was placed. The 90 cm 6 French shuttle was advanced over a VT K catheter into the left common carotid artery. An arteriogram was done which showed recanalization of the left internal carotid artery from the TNK, but a 99% stenosis with adherent thrombus. A carotid terminus embolus was also noted. The lesion was crossed with a synchro support wire and angioplastied with a 4 mm balloon. The Shuttle was advanced over the balloon such that there was no flow reestablished in the internal carotid artery. The Shuttle was then aspirated. The  red 62 catheter was advanced over a velocity catheter and synchro support wire beyond the occlusion into the left M2. The 4 mm solitaire was then deployed. The aspiration catheter was then advanced to engage the thrombus and the stent retriever and aspiration catheter were then withdrawn together. This resulted in removal of the large thrombus. Additional thrombus was aspirated through the shuttle sheath. A follow-up arteriogram now demonstrated complete reperfusion intracranially. The shuttle was then withdrawn proximal to the internal carotid artery under aspiration. An arteriogram was performed of the left common carotid artery with cervical and cerebral imaging to assess the result of the thrombectomy, but also to assess the residual internal carotid artery lesion. This demonstrated that the intracranial vessels remained patent, but there was a greater than 90% stenosis of the left internal carotid artery. Due to concern for rethrombosis of the artery, the decision was made to proceed with stent placement. The severe carotid lesion was wired with the synchro support wire. A 8 x 6 x 40 mm Xact stent was advanced across the lesion and deployed. No post dilatation. A final follow-up arteriogram was done which confirmed patency of the stent without acute thrombus formation. The intracranial vessels remain patent with normal flow in the left anterior, middle and posterior cerebral artery territories. FINDINGS: Left carotid terminus and middle cerebral artery embolus with 99% left internal carotid artery stenosis. TICI 3 following intervention. 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 TICI 3 following intervention of intracranial embolectomy and left carotid stent placement Codes: 38354, 23062, 62783 Electronically Signed   By: Nancyann Burns M.D.   On: 10/08/2024 09:29   CT HEAD POST STROKE FOLLOWUP/TIMED/STAT READ Result Date:  10/07/2024 EXAM: CT HEAD WITHOUT CONTRAST 10/07/2024 06:44:00 PM TECHNIQUE: CT of the head was performed without the administration of intravenous contrast. Automated exposure control, iterative reconstruction, and/or weight based adjustment of the mA/kV was utilized to reduce the radiation dose to as low as reasonably achievable. COMPARISON: 10/07/2024 CLINICAL HISTORY: Acute neurologic deficit; stroke suspected. FINDINGS: BRAIN AND VENTRICLES: No acute hemorrhage. Previously noted subtle areas of hypoattenuation in the left MCA territory white matter are not well visualized on the current study due to streak artifact. There is a question of a new area of hypoattenuation in the posterolateral left temporal lobe which  could reflect a region of acute infarct, although streak artifact also limits this region as well. No hydrocephalus. No extra-axial collection. No edema, mass effect, or midline shift. ORBITS: No acute abnormality. SINUSES: Retention cyst in the right maxillary sinus. SOFT TISSUES AND SKULL: EEG leads are noted overlying the scalp. Streak artifact, partially related to the EEG leads, slightly limits evaluation. Fluid layering within the nasopharynx is partially visualized. An enteric tube is present. No skull fracture. IMPRESSION: 1. Questionable new area of hypoattenuation in the posterolateral left temporal lobe, possibly representing an acute infarct, although evaluation is limited by streak artifact. 2. No edema or midline shift. 3. No acute intracranial hemorrhage. Electronically signed by: Donnice Mania MD 10/07/2024 07:07 PM EST RP Workstation: [id number]   DG Abd 1 View Result Date: 10/07/2024 CLINICAL DATA:  Feeding tube placement. EXAM: ABDOMEN - 1 VIEW COMPARISON:  None Available. FINDINGS: Tip and side port of the enteric tube are below the diaphragm in the stomach. No bowel dilatation in the upper abdomen. Excreted IV contrast in the renal collecting systems from earlier CTA. IMPRESSION:  Tip and side port of the enteric tube below the diaphragm in the stomach. Electronically Signed   By: Andrea Gasman M.D.   On: 10/07/2024 18:23   Portable Chest x-ray Result Date: 10/07/2024 CLINICAL DATA:  Status post intubation. EXAM: PORTABLE CHEST 1 VIEW COMPARISON:  October 07, 2024 (1:01 p.m.) FINDINGS: Since the prior study there is been interval placement of an endotracheal tube with its distal tip approximately 2.6 cm from the carina. The heart size and mediastinal contours are within normal limits. Low lung volumes are noted. No acute infiltrate, pleural effusion or pneumothorax is identified. The visualized skeletal structures are unremarkable. IMPRESSION: Interval endotracheal tube placement, as described above, without acute or active cardiopulmonary disease. Electronically Signed   By: Suzen Dials M.D.   On: 10/07/2024 17:02    Vitals:   10/08/24 0800 10/08/24 1000 10/08/24 1105 10/08/24 1130  BP:  (!) 111/50 (!) 159/61   Pulse:  (!) 52 65   Resp:  18 20   Temp: 98.3 F (36.8 C)   97.7 F (36.5 C)  TempSrc: Axillary   Axillary  SpO2:  100% 100%   Weight:      Height:         PHYSICAL EXAM General: Intubated, ill-appearing patient in no acute distress Psych:  Mood and affect appropriate for situation CV: Regular rate and rhythm on monitor Respiratory: Respirations synchronous with ventilator   NEURO:  Patient does not respond to name or follow commands.  Pupils 2 mm and sluggishly reactive, corneal reflex present on the left greater than on the right, cough and gag reflexes present, will move all 4 extremities to noxious stimuli with the left arm moving more than the right  Most Recent NIH  1a Level of Conscious.: 2 1b LOC Questions: 2 1c LOC Commands: 2 2 Best Gaze: 0 3 Visual: 0 4 Facial Palsy: 0 5a Motor Arm - left: 2 5b Motor Arm - Right: 3 6a Motor Leg - Left: 3 6b Motor Leg - Right: 3 7 Limb Ataxia: 0 8 Sensory: 0 9 Best Language: 3 10  Dysarthria: 2 11 Extinct. and Inatten.: 0 TOTAL: 22   ASSESSMENT/PLAN  Ms. Alice Lara is a 69 y.o. female with history of hypertension, hypothyroidism and tobacco use admitted after a seizure at work and was found to have left MCA occlusion.  Patient was given TNK and taken for mechanical  thrombectomy, which was successful.  Left carotid stent was placed.  She did have some complications at her groin site and was noted to have a pseudoaneurysm today, so we will follow-up with ultrasound again in 2 days.  She does have significant tongue swelling and did receive steroids and Benadryl .  Tongue swelling appears to be improving, so we will wait for resolution before attempting extubation.  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 and TNK Etiology: Large vessel occlusion CT head abnormal density of the left MCA within the sylvian fissure concerning for thrombosis, subtle hypoattenuation in the white matter of the left MCA territory without definite loss of gray-white differentiation CTA head & neck occlusion of dominant left MCA M2 inferior division branch, acute occlusion of proximal left cervical ICA with distal reconstitution at the cavernous segment, moderate stenosis of right cavernous ICA MRI pending MRA pending Carotid Doppler 60 to 79% stenosis of left ICA with high-grade stenosis noted in left middle to distal stent 2D Echo EF 55-60% LDL 139 HgbA1c 5.6 VTE prophylaxis -SCDs No antithrombotic prior to admission, now on aspirin  81 mg daily and Brilinta  (ticagrelor ) 90 mg bid  Therapy recommendations:  Pending Disposition: Pending  Tongue swelling/possible allergic reaction Patient has significant swelling of the tongue Question allergic reaction to IV contrast or TNK Received steroids and Benadryl  yesterday, and swelling appears to be improving  Seizure Patient had witnessed seizure at work prior to presentation Routine EEG demonstrates  generalized slowing with no seizures or epileptiform discharges Continue Keppra  500 mg twice daily On LTM EEG  Femoral A pseudoaneurysm  LE artery doppler - An area with well defined borders measuring 1.3 cm x 1.1 cm was visualized arising off of the common femoral artery with ultrasound characteristics of a pseudoaneurysm. The neck measures approximately 0.2 cm wide and 0.3 cm long.  Groin wound stable, soft Repeat artery doppler in 48 hours  Hypotension history of hypertension Home meds: Losartan-hydrochlorothiazide 50-12.5 mg daily Unstable, requiring Levophed  BP sensitive to propofol  and fentanyl  Wean off levophed  as able  Hyperlipidemia Home meds: None LDL 139, goal < 70 Add atorvastatin  40 mg daily Continue statin at discharge  Tobacco Abuse Patient smokes 1 pack/day Will discuss cessation when patient extubated  Respiratory failure Patient intubated for procedure Unable to extubate due to tongue swelling today, will continue to monitor Ventilator management per CCM  Dysphagia Patient has post-stroke dysphagia, SLP consulted NPO now Has cortrak Consider TF if appropriate.   Other Stroke Risk Factors Advanced age  Other Active Problems Leukocytosis WBC 15.1 Bradycardia with propofol  and fentanyl   Hospital day # 1  Patient seen by NP with MD, MD to edit note as needed. Cortney E Everitt Clint Kill , MSN, AGACNP-BC [facility] See Amion for schedule and pager information 10/08/2024 2:08 PM  ATTENDING NOTE: I reviewed above note and agree with the assessment and plan. Pt was seen and examined.   Husband and daughter are at the bedside. Pt is intubated on fentanyl  push, just after push on my encounter. Her eyes closed, not following commands. With forced eye opening, eyes in mid position, not blinking to visual threat, doll's eyes absent, not tracking, pupils 3mm, sluggish to light. Corneal reflex present on the left, absent on the right, gag and cough  present. Breathing  over the vent.  Facial symmetry not able to test due to ET tube.  Tongue protrusion not cooperative. On pain stimulation, LUE active movement against gravity. Mild  withdraw RUE. BLE withdraw to pain, L more thanright. no babinski. Sensation, coordination and gait not tested.  For detailed assessment and plan, please refer to above as I have made changes wherever appropriate.   Ary Cummins, MD PhD Stroke Neurology 10/08/2024 6:58 PM  This patient is critically ill due to stroke s/p TNK and thrombectomy, and stenting, seizure, hypotension, respiratory failure and at significant risk of neurological worsening, death form recurrent stroke, hemorrhagic conversion, status epilepticus, shock. This patient's care requires constant monitoring of vital signs, hemodynamics, respiratory and cardiac monitoring, review of multiple databases, neurological assessment, discussion with family, other specialists and medical decision making of high complexity. I spent 40 minutes of neurocritical care time in the care of this patient. I had long discussion with husband and daughter at bedside, updated pt current condition, treatment plan and potential prognosis, and answered all the questions. They expressed understanding and appreciation. I also discussed with CCM Dr. Harold       To contact Stroke Continuity provider, please refer to [email or web address]. After hours, contact General Neurology

## 2024-10-08 NOTE — Plan of Care (Addendum)
 0900:  Echo tech verified with PA that the pt can receive Definity  during ECHO procedure.  See ECHO results.  9061:  Dr. Jerri rounded on pt.  See provider note.  9057:  Dr. Lester rounded on pt. See provider note.  9057:  Ultrasound tech at bedside carotid and LE. See results.  1200:  Unable to obtain MRI.  Pt became extremely agitated and fighting to get off of the MRI table.  Dr. Harold notified from MRI 3 x Fentanyl  PRNs given and Propofol  increased from 10 mcg/kg/min to 30 mcg/kg/min with increasing agitation. Given verbal order from Dr. Harold to increase pt Propofol  to 50 mcg/kg/min.  Pts blood pressures did not tolerate.  Decision made at this time by Dr. Harold and RN to return to the unit with the pt due to instability.  Pts family updated on trip to MRI and return.   Problem: Education: Goal: Knowledge of disease or condition will improve Outcome: Progressing Goal: Knowledge of secondary prevention will improve (MUST DOCUMENT ALL) Outcome: Progressing Goal: Knowledge of patient specific risk factors will improve (DELETE if not current risk factor) Outcome: Progressing   Problem: Ischemic Stroke/TIA Tissue Perfusion: Goal: Complications of ischemic stroke/TIA will be minimized Outcome: Progressing   Problem: Coping: Goal: Will verbalize positive feelings about self Outcome: Progressing Goal: Will identify appropriate support needs Outcome: Progressing   Problem: Health Behavior/Discharge Planning: Goal: Ability to manage health-related needs will improve Outcome: Progressing Goal: Goals will be collaboratively established with patient/family Outcome: Progressing   Problem: Self-Care: Goal: Ability to participate in self-care as condition permits will improve Outcome: Progressing Goal: Verbalization of feelings and concerns over difficulty with self-care will improve Outcome: Progressing Goal: Ability to communicate needs accurately will improve Outcome: Progressing   Problem:  Nutrition: Goal: Risk of aspiration will decrease Outcome: Progressing Goal: Dietary intake will improve Outcome: Progressing   Problem: Education: Goal: Knowledge of General Education information will improve Description: Including pain rating scale, medication(s)/side effects and non-pharmacologic comfort measures Outcome: Progressing   Problem: Health Behavior/Discharge Planning: Goal: Ability to manage health-related needs will improve Outcome: Progressing   Problem: Clinical Measurements: Goal: Ability to maintain clinical measurements within normal limits will improve Outcome: Progressing Goal: Will remain free from infection Outcome: Progressing Goal: Diagnostic test results will improve Outcome: Progressing Goal: Respiratory complications will improve Outcome: Progressing Goal: Cardiovascular complication will be avoided Outcome: Progressing   Problem: Activity: Goal: Risk for activity intolerance will decrease Outcome: Progressing   Problem: Nutrition: Goal: Adequate nutrition will be maintained Outcome: Progressing   Problem: Coping: Goal: Level of anxiety will decrease Outcome: Progressing   Problem: Elimination: Goal: Will not experience complications related to bowel motility Outcome: Progressing Goal: Will not experience complications related to urinary retention Outcome: Progressing   Problem: Pain Managment: Goal: General experience of comfort will improve and/or be controlled Outcome: Progressing   Problem: Safety: Goal: Ability to remain free from injury will improve Outcome: Progressing   Problem: Skin Integrity: Goal: Risk for impaired skin integrity will decrease Outcome: Progressing   Problem: Education: Goal: Ability to describe self-care measures that may prevent or decrease complications (Diabetes Survival Skills Education) will improve Outcome: Progressing Goal: Individualized Educational Video(s) Outcome: Progressing   Problem:  Coping: Goal: Ability to adjust to condition or change in health will improve Outcome: Progressing   Problem: Fluid Volume: Goal: Ability to maintain a balanced intake and output will improve Outcome: Progressing   Problem: Health Behavior/Discharge Planning: Goal: Ability to identify and utilize available resources  and services will improve Outcome: Progressing Goal: Ability to manage health-related needs will improve Outcome: Progressing   Problem: Metabolic: Goal: Ability to maintain appropriate glucose levels will improve Outcome: Progressing   Problem: Nutritional: Goal: Maintenance of adequate nutrition will improve Outcome: Progressing Goal: Progress toward achieving an optimal weight will improve Outcome: Progressing   Problem: Skin Integrity: Goal: Risk for impaired skin integrity will decrease Outcome: Progressing   Problem: Tissue Perfusion: Goal: Adequacy of tissue perfusion will improve Outcome: Progressing   Problem: Activity: Goal: Ability to tolerate increased activity will improve Outcome: Progressing   Problem: Respiratory: Goal: Ability to maintain a clear airway and adequate ventilation will improve Outcome: Progressing   Problem: Role Relationship: Goal: Method of communication will improve Outcome: Progressing   Problem: Education: Goal: Understanding of CV disease, CV risk reduction, and recovery process will improve Outcome: Progressing Goal: Individualized Educational Video(s) Outcome: Progressing   Problem: Activity: Goal: Ability to return to baseline activity level will improve Outcome: Progressing   Problem: Cardiovascular: Goal: Ability to achieve and maintain adequate cardiovascular perfusion will improve Outcome: Progressing Goal: Vascular access site(s) Level 0-1 will be maintained Outcome: Progressing

## 2024-10-09 ENCOUNTER — Inpatient Hospital Stay (HOSPITAL_COMMUNITY)

## 2024-10-09 DIAGNOSIS — G9340 Encephalopathy, unspecified: Secondary | ICD-10-CM

## 2024-10-09 DIAGNOSIS — I63232 Cerebral infarction due to unspecified occlusion or stenosis of left carotid arteries: Secondary | ICD-10-CM

## 2024-10-09 LAB — BASIC METABOLIC PANEL WITH GFR
Anion gap: 7 (ref 5–15)
BUN: 10 mg/dL (ref 8–23)
CO2: 27 mmol/L (ref 22–32)
Calcium: 8.7 mg/dL — ABNORMAL LOW (ref 8.9–10.3)
Chloride: 110 mmol/L (ref 98–111)
Creatinine, Ser: 0.6 mg/dL (ref 0.44–1.00)
GFR, Estimated: >60 mL/min
Glucose, Bld: 117 mg/dL — ABNORMAL HIGH (ref 70–99)
Potassium: 4 mmol/L (ref 3.5–5.1)
Sodium: 144 mmol/L (ref 135–145)

## 2024-10-09 LAB — CBC
HCT: 30.4 % — ABNORMAL LOW (ref 36.0–46.0)
Hemoglobin: 10.2 g/dL — ABNORMAL LOW (ref 12.0–15.0)
MCH: 30.4 pg (ref 26.0–34.0)
MCHC: 33.6 g/dL (ref 30.0–36.0)
MCV: 90.7 fL (ref 80.0–100.0)
Platelets: 226 10*3/uL (ref 150–400)
RBC: 3.35 MIL/uL — ABNORMAL LOW (ref 3.87–5.11)
RDW: 13 % (ref 11.5–15.5)
WBC: 15 10*3/uL — ABNORMAL HIGH (ref 4.0–10.5)
nRBC: 0 % (ref 0.0–0.2)

## 2024-10-09 LAB — GLUCOSE, CAPILLARY
Glucose-Capillary: 104 mg/dL — ABNORMAL HIGH (ref 70–99)
Glucose-Capillary: 109 mg/dL — ABNORMAL HIGH (ref 70–99)
Glucose-Capillary: 118 mg/dL — ABNORMAL HIGH (ref 70–99)
Glucose-Capillary: 128 mg/dL — ABNORMAL HIGH (ref 70–99)
Glucose-Capillary: 90 mg/dL (ref 70–99)
Glucose-Capillary: 90 mg/dL (ref 70–99)

## 2024-10-09 MED ORDER — HEPARIN SODIUM (PORCINE) 5000 UNIT/ML IJ SOLN
5000.0000 [IU] | Freq: Three times a day (TID) | INTRAMUSCULAR | Status: AC
  Administered 2024-10-09 – 2024-10-10 (×4): 5000 [IU] via SUBCUTANEOUS
  Filled 2024-10-09 (×4): qty 1

## 2024-10-09 MED ORDER — LORAZEPAM 2 MG/ML IJ SOLN
2.0000 mg | INTRAMUSCULAR | Status: DC | PRN
  Administered 2024-10-09 – 2024-10-10 (×5): 2 mg via INTRAVENOUS
  Filled 2024-10-09 (×5): qty 1

## 2024-10-09 MED ORDER — NICOTINE 7 MG/24HR TD PT24
7.0000 mg | MEDICATED_PATCH | Freq: Every day | TRANSDERMAL | Status: AC
  Administered 2024-10-09 – 2024-10-10 (×2): 7 mg via TRANSDERMAL
  Filled 2024-10-09 (×2): qty 1

## 2024-10-09 MED ORDER — LORAZEPAM 2 MG/ML IJ SOLN
INTRAMUSCULAR | Status: AC
  Administered 2024-10-09: 2 mg via INTRAVENOUS
  Filled 2024-10-09: qty 2

## 2024-10-09 MED ORDER — QUETIAPINE FUMARATE 50 MG PO TABS
50.0000 mg | ORAL_TABLET | Freq: Two times a day (BID) | ORAL | Status: DC
  Administered 2024-10-09 (×2): 50 mg
  Filled 2024-10-09 (×2): qty 1

## 2024-10-09 MED ORDER — CLONAZEPAM 1 MG PO TABS
1.0000 mg | ORAL_TABLET | Freq: Two times a day (BID) | ORAL | Status: DC
  Administered 2024-10-09 (×2): 1 mg
  Filled 2024-10-09 (×2): qty 1

## 2024-10-09 NOTE — Procedures (Signed)
 Patient Name: Alice Lara  MRN: [id number]  Epilepsy Attending: Arlin MALVA Krebs  Referring Physician/Provider: Antonetta Vina NOVAK, NP  Duration: 10/08/2024 1845 to 10/09/2024 2000   Patient history: 69yo F with new onset seizure. EEG to evaluate for seizure.   Level of alertness: awake, asleep   AEDs during EEG study: LEV   Technical aspects: This EEG study was done with scalp electrodes positioned according to the 10-20 International system of electrode placement. Electrical activity was reviewed with band pass filter of 1-70Hz , sensitivity of 7 uV/mm, display speed of 67mm/sec with a 60Hz  notched filter applied as appropriate. EEG data were recorded continuously and digitally stored.  Video monitoring was available and reviewed as appropriate.   Description: EEG initially showed posterior dominant rhythm of 8-9 Hz activity of moderate voltage (25-35 uV) seen predominantly in posterior head regions, symmetric and reactive to eye opening and eye closing. Sleep was characterized by vertex waves, sleep spindles (12 to 14 Hz), maximal frontocentral region.  Gradually, EEG worsened and showed continuous generalized sharply contoured 3 to 6 Hz theta and delta slowing.  Additionally there were sharply contoured quasi periodic 0.25 to 1 Hz discharges noted in bilateral posterior quadrant. . Hyperventilation and photic stimulation were not performed.    EEG was disconnected between 10/09/2024 0959 to 1113 for testing   ABNORMALITY - Continuous slow, generalized and maximal bilateral posterior quadrant   IMPRESSION: This study was suggestive of cortical dysfunction in bilateral posterior quadrant likely secondary to underlying structural abnormality.  Additionally there is generalized cerebral dysfunction (encephalopathy).    Sharply contoured waves were noted in bilateral posterior quadrant.  Recommend continued monitoring for further evaluation if these are epileptiform or not.  No definite seizures  were noted  Dr. Jerri was notified.  Kearney Evitt O Caliya Narine

## 2024-10-09 NOTE — Progress Notes (Addendum)
 "  NAME:  KASSIDY DOCKENDORF, MRN:  [id number], DOB:  10/10/1955, LOS: 2 ADMISSION DATE:  10/07/2024, CONSULTATION DATE:  10/07/24 REFERRING MD:  Dr. Michaela, CHIEF COMPLAINT:  post op L M2 thrombectomy, l carotid stent   History of Present Illness:  Alice Lara is a 69 y.o. female with PMH of HTN, tobacco use who presented to the ED today with reported syncopal episode and seizure like activity. Patient was reportedly at work today when she began feeling lightheaded, had apparent seizure like activity with postictal state for approximately 10 minutes. Patient then reportedly returned backed to her baseline, fully awake and oriented. While in the ED patient had a second witnessed seizure, loaded with keppra , and given 4mg  Ativan . It was noticed patient had new neurological deficits to include not moving the RUE. 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 ED discussed case with neurology, patient was given TNK, and taken to IR for thrombectomy and carotid artery stent placement.  Patient remains intubated, sedated on propofol  and fentanyl . cEEG monitoring ordered, with continuation of Keppra  500mg  BID. Repeat CTH ordered for tonight, and MRI tomorrow.   Pertinent  Medical History   has no past medical history on file.   Significant Hospital Events: Including procedures, antibiotic start and stop dates in addition to other pertinent events   2/3: PCCM consulted s/p TNK, left MCA M2 LVO s/p thrombectomy, left carotid artery stent 2/4: extubated   Interim History / Subjective:   Patient now extubated, however due to MRI/MRA planned for this morning patient reportedly received ativan  and haldol , in addition to precedex  to ensure images able to be obtained. Patient is not awake but protecting airway at this time, not following commands.   Objective    Blood pressure (!) 123/44, pulse (!) 59, temperature 99.5 F (37.5 C), temperature source  Axillary, resp. rate (!) 22, height 5' 6 (1.676 m), weight 68 kg, SpO2 100%.    Vent Mode: PRVC FiO2 (%):  [40 %] 40 % Set Rate:  [20 bmp] 20 bmp Vt Set:  [470 mL] 470 mL PEEP:  [5 cmH20] 5 cmH20 Plateau Pressure:  [15 cmH20-16 cmH20] 15 cmH20   Intake/Output Summary (Last 24 hours) at 10/09/2024 1034 Last data filed at 10/09/2024 9370 Gross per 24 hour  Intake 1186.66 ml  Output 2300 ml  Net -1113.34 ml   Filed Weights   10/07/24 1149  Weight: 68 kg   Physical Exam Vitals and nursing note reviewed.  Constitutional:      Appearance: She is ill-appearing.  HENT:     Head: Normocephalic.     Right Ear: External ear normal.     Left Ear: External ear normal.     Mouth/Throat:     Comments: Tongue swelling continues to improve Cardiovascular:     Rate and Rhythm: Regular rhythm. Bradycardia present.  Abdominal:     General: Abdomen is flat. Bowel sounds are normal.     Palpations: Abdomen is soft.  Musculoskeletal:     Right lower leg: No edema.     Left lower leg: No edema.  Skin:    Findings: Bruising present.  Neurological:     Comments: Patient is sedated on precedex  and recently received ativan /haldol , not following commands or participating in conversation.         Resolved problem list   Acute Respiratory Insufficiency, Intubated in IR, extubated 2/4 Tongue Swelling, suspected 2/2 bite during seizure with  active bleeding, possibly exacerbated with TNK. 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 Patient returned to her baseline, however had another witnessed seizure while in the ED. CTH revealed left MCA M2 LVO, and was given TNK and taken for thrombectomy. -Management per neurology  -SBP goal 120-160 -cEEG monitoring  -Continue keppra   500mg  BID, ASA, Brillinta  -MRI/MRA ordered this morning   Acute normocytic anemia -hgb 10.2 from 13 on admission suspect 2/2 bleeding from tongue post seizure and R femoral access site bleeding post op w/ TNK  HTN -pta losartan 50mg  hydrochlorothiazide 12.5 mg daily   Reported Tobacco Use disorder   Critical care time: 32 minutes     Keven Fila, PA-C [facility] For pager details, please see AMION or use Epic chat  After 1900, please call [facility] for cross coverage needs 10/09/2024, 10:34 AM    "

## 2024-10-09 NOTE — Progress Notes (Addendum)
 LTM maint complete - no skin breakdown seen. [facility] monitored, Event button test confirmed by [facility].

## 2024-10-09 NOTE — Progress Notes (Signed)
 PT Cancellation Note  Patient Details Name: Alice Lara MRN: [id number] DOB: 1956/06/24   Cancelled Treatment:    Reason Eval/Treat Not Completed: Patient not medically ready RN reports pt not yet appropriate (active bedrest order, obtunded and not following commands.)  Will follow up another day.   Montie Portal 10/09/2024, 9:50 AM Micheline Portal, PT Acute Rehabilitation Services Office:[phone] 10/09/2024

## 2024-10-09 NOTE — Progress Notes (Addendum)
 STROKE TEAM PROGRESS NOTE    SIGNIFICANT HOSPITAL EVENTS 2/3: Patient admitted postseizure and found to have left MCA occlusion, TNK given and mechanical thrombectomy performed, remains intubated  INTERIM HISTORY/SUBJECTIVE  Patient is seen in her room with no family at the bedside.  She remains hemodynamically stable and afebrile and has been extubated.  She is doing well but has had significant agitation.  EEG demonstrates sharp waves in bilateral posterior quadrants, so we will continue to monitor.  OBJECTIVE  CBC    Component Value Date/Time   WBC 15.0 (H) 10/09/2024 0548   RBC 3.35 (L) 10/09/2024 0548   HGB 10.2 (L) 10/09/2024 0548   HCT 30.4 (L) 10/09/2024 0548   PLT 226 10/09/2024 0548   MCV 90.7 10/09/2024 0548   MCH 30.4 10/09/2024 0548   MCHC 33.6 10/09/2024 0548   RDW 13.0 10/09/2024 0548   LYMPHSABS 1.6 10/07/2024 1126   MONOABS 0.5 10/07/2024 1126   EOSABS 0.1 10/07/2024 1126   BASOSABS 0.1 10/07/2024 1126    BMET    Component Value Date/Time   NA 144 10/09/2024 0548   K 4.0 10/09/2024 0548   CL 110 10/09/2024 0548   CO2 27 10/09/2024 0548   GLUCOSE 117 (H) 10/09/2024 0548   BUN 10 10/09/2024 0548   CREATININE 0.60 10/09/2024 0548   CALCIUM  8.7 (L) 10/09/2024 0548   GFRNONAA >60 10/09/2024 0548    IMAGING past 24 hours DG Abd Portable 1V Result Date: 10/09/2024 CLINICAL DATA:  Nasogastric tube placement. EXAM: PORTABLE ABDOMEN - 1 VIEW COMPARISON:  Same day FINDINGS: Distal tip of nasogastric tube is seen in expected position of proximal stomach. IMPRESSION: Distal tip of nasogastric tube is seen in expected position of proximal stomach. Electronically Signed   By: Lynwood Landy Raddle M.D.   On: 10/09/2024 13:32   DG Abd 1 View Result Date: 10/09/2024 CLINICAL DATA:  Nasogastric tube placement. EXAM: ABDOMEN - 1 VIEW COMPARISON:  Two days ago FINDINGS: Nasogastric tube appears to be looped in distal esophagus with distal tip seen extending retrograde into  proximal esophagus. Repositioning is recommended. IMPRESSION: Nasogastric tube appears to be looped in distal esophagus with distal tip seen extending retrograde into proximal esophagus. Repositioning is recommended. Electronically Signed   By: Lynwood Landy Raddle M.D.   On: 10/09/2024 13:31   MR ANGIO HEAD WO CONTRAST Result Date: 10/09/2024 EXAM: MR Angiography Head without intravenous Contrast. 10/09/2024 11:01:51 AM TECHNIQUE: Magnetic resonance angiography images of the head without intravenous contrast. Multiplanar 2D and 3D reformatted images are provided for review. COMPARISON: Brain MRI 10/09/2024, CTA Head and Neck 10/07/2024. CLINICAL HISTORY: 69 year old female. Stroke, follow-up. Left MCA M2 branch occlusion, proximal left ICA occlusion prior, post revascularization. FINDINGS: ANTERIOR CIRCULATION: Left Internal Carotid Artery (ICA): Antegrade flow now detected throughout the left ICA siphon. Atherosclerotic irregularity at the proximal cavernous segment on series 6 image 104, no left siphon stenosis. Tortuosity of the left ophthalmic artery origin is redemonstrated and stable. No discrete aneurysm. Right Internal Carotid Artery (ICA): No significant stenosis. Left Middle Cerebral Artery (MCA): Resolved posterior left MCA branch occlusion and flow cutoff (series 652 image 7). Left MCA origin is patent and within normal limits. Right Middle Cerebral Artery (MCA): No significant stenosis. Anterior Cerebral Arteries (ACA): ACA origins are patent and within normal limits. No significant stenosis. No anterior circulation stenosis or occlusion identified. POSTERIOR CIRCULATION: Left Posterior Cerebral Artery (PCA): Fetal type left PCA origin, left P1 segment diminutive or absent (normal variant). Right Posterior Cerebral  Artery (PCA): Normal right PCA origin. No significant stenosis of the posterior cerebral arteries. Basilar Artery: No significant stenosis. Vertebral Arteries: No significant stenosis. No  discrete aneurysm. IMPRESSION: 1. Patent intracranial circulation, no significant stenosis. Electronically signed by: Helayne Hurst MD 10/09/2024 11:15 AM EST RP Workstation: [id number]   MR BRAIN WO CONTRAST Result Date: 10/09/2024 EXAM: MRI BRAIN WITHOUT CONTRAST 10/09/2024 11:01:13 AM TECHNIQUE: Multiplanar multisequence MRI of the head/brain was performed without the administration of intravenous contrast. COMPARISON: CT and CTA 10/07/2024. CLINICAL HISTORY: 69 year old female. Follow-up for stroke with left Middle Cerebral Artery M2 and proximal left Internal Carotid Artery occlusion, status post endovascular revascularization. FINDINGS: Intermittent motion artifact. BRAIN AND VENTRICLES: Cortical and white matter diffusion restriction and infarction extending from the posterior left insula along the sylvian fissure toward the superior perirolandic area. T2 and FLAIR hyperintense associated cytotoxic edema. Small petechial hemorrhage in the posterior left insula (1 cm) with no regional mass effect. No other acute hemorrhage. No contralateral or posterior fossa diffusion restriction. There is perhaps minimal additional diffusion abnormality at the left Middle Cerebral Artery / Posterior Cerebral Artery watershed area (series 5 image 34). Unrelated chronic microhemorrhage in the right occipital pole white matter on series 12 image 15. No midline shift or intracranial mass effect. Probable congenital sulcus variation along the superior right hemisphere (normal variant series 8 image 11). No hydrocephalus. The sellar/suprasellar regions appear unremarkable. Normal flow voids. ORBITS: No significant abnormality. SINUSES AND MASTOIDS: Paranasal sinuses and mastoids are well aerated. BONES AND SOFT TISSUES: Normal marrow signal. Negative visible cervical spine. Trace retained secretions in the visible pharynx. No soft tissue abnormality. HEIDELBERG BLEEDING CLASSIFICATION: 1a; HI1 (Small petechial hemorrhage in the  posterior left insula (1 cm) with no regional mass effect) IMPRESSION: 1. Left MCA territory infarction with cytotoxic edema and small petechial hemorrhage, no mass effect. Electronically signed by: Helayne Hurst MD 10/09/2024 11:11 AM EST RP Workstation: [id number]    Vitals:   10/09/24 1230 10/09/24 1245 10/09/24 1300 10/09/24 1315  BP: (!) 120/59 113/68 (!) 119/58 97/73  Pulse: 70 75 70 76  Resp: 18 19 18 20   Temp:      TempSrc:      SpO2: 100% 100% 100% 100%  Weight:      Height:         PHYSICAL EXAM General: Ill-appearing elderly patient in no acute distress Psych:  Mood and affect appropriate for situation CV: Regular rate and rhythm on monitor Respiratory: Respirations regular and unlabored on supplemental O2   NEURO (seen after patient had received Ativan  for agitation):  Patient does not respond to name or follow commands but will open eyes to voice or noxious.  Peoples are equal round and reactive to light.  She will move all 4 extremities nonpurposefully.   ASSESSMENT/PLAN  Ms. DARE SPILLMAN is a 69 y.o. female with history of hypertension, hypothyroidism and tobacco use admitted after a seizure at work and was found to have left MCA occlusion.  Patient was given TNK and taken for mechanical thrombectomy, which was successful.  Left carotid stent was placed.  She did have some complications at her groin site and was noted to have a pseudoaneurysm today, so we will follow-up with ultrasound again in 2 days.  She does have significant tongue swelling and did receive steroids and Benadryl .  Tongue swelling appears to be improving, so we will wait for resolution before attempting extubation.  NIH on Admission 23  Stroke:  left MCA territory moderate infarct  with L ICA and M2 occlusion s/p TNK and IR with TICI3 and L ICA stenting, etiology: Large vessel disease CT head abnormal density of the left MCA within the sylvian fissure concerning for thrombosis, subtle hypoattenuation  in the white matter of the left MCA territory without definite loss of gray-white differentiation CTA head & neck occlusion of dominant left MCA M2 inferior division branch, acute occlusion of proximal left cervical ICA with distal reconstitution at the cavernous segment, moderate stenosis of right cavernous ICA MRI left MCA territory infarct with cytotoxic edema and petechial hemorrhage MRA no hemodynamically significant stenosis Carotid Doppler 60 to 79% stenosis of left ICA with high-grade stenosis noted in left middle to distal stent 2D Echo EF 55-60% LDL 139 HgbA1c 5.6 VTE prophylaxis -heparin  subq No antithrombotic prior to admission, now on aspirin  81 mg daily and Brilinta  (ticagrelor ) 90 mg bid  Therapy recommendations:  Pending Disposition: Pending  Tongue swelling/possible allergic reaction Patient has significant swelling of the tongue Question allergic reaction to IV contrast or TNK Received steroids and Benadryl  swelling appears to be improving  Seizure Agitation  Patient had witnessed seizure at work prior to presentation Routine EEG demonstrates generalized slowing with no seizures or epileptiform discharges Continues to be agitated, combative, trying to get out of bed Off precedex  Continue Keppra  500 mg twice daily LTM EEG 2/4 to 2/5: Continuous slow, generalized and maximal bilateral posterior quadrant.  This study was suggestive of cortical dysfunction in bilateral posterior quadrant likely secondary to underlying structural abnormality.  Additionally there is generalized cerebral dysfunction (encephalopathy).   Sharply contoured waves were noted in bilateral posterior quadrant.  Recommend continued monitoring for further evaluation if these are epileptiform or not. on haldol  and ativan  PRN Add clonazepam  1mg  bid and seroquel  50mg  bid   Femoral A pseudoaneurysm  LE artery doppler - An area with well defined borders measuring 1.3 cm x 1.1 cm was visualized arising off  of the common femoral artery with ultrasound characteristics of a pseudoaneurysm. The neck measures approximately 0.2 cm wide and 0.3 cm long.  Groin wound stable, soft Repeat artery doppler tomorrow  Hypotension history of hypertension Home meds: Losartan-hydrochlorothiazide 50-12.5 mg daily Stable now, of Levophed  BP sensitive to propofol  and fentanyl  Wean off levophed  as able  Hyperlipidemia Home meds: None LDL 139, goal < 70 Add atorvastatin  40 mg daily Continue statin at discharge  Tobacco Abuse Patient smokes 1 pack/day On nicotine  patch Will discuss cessation when patient extubated  Dysphagia Patient has post-stroke dysphagia, SLP consulted NPO now Has NG tube Consider TF in am  Other Stroke Risk Factors Advanced age  Other Active Problems Leukocytosis WBC 15.1-> 15.0 Fever Tmax 101.7  Hospital day # 2  Patient seen by NP with MD, MD to edit note as needed. Cortney E Everitt Clint Kill , MSN, AGACNP-BC [facility] See Amion for schedule and pager information 10/09/2024 2:26 PM  ATTENDING NOTE: I reviewed above note and agree with the assessment and plan. Pt was seen and examined.   Husband and 3 daughters are at the bedside. Pt overnight agitated, on precedex  but also need haldol  and ativan  to keep calm and was able to get MRI and MRA done, which showed left MCA stroke but vessel now patent. EEG showed left side slowing, continue keepra and add clonazepam  and seroquel . Had NG tube and continue po meds including brilinta . Tongue swelling improved, repeat US  for groin aneurysm tomorrow. Has not started TF yet. Still has leukocytosis and one time spiking  fever. On statin.   I had long discussion with husband and daughters at bedside, updated pt current condition, treatment plan and potential prognosis, and answered all the questions. They expressed understanding and appreciation.   For detailed assessment and plan, please refer to above as I have made changes  wherever appropriate.   Ary Cummins, MD PhD Stroke Neurology 10/09/2024 9:13 PM  This patient is critically ill due to stroke s/p TNK and thrombectomy, and stenting, seizure, hypotension, respiratory failure and at significant risk of neurological worsening, death form recurrent stroke, hemorrhagic conversion, status epilepticus, shock. This patient's care requires constant monitoring of vital signs, hemodynamics, respiratory and cardiac monitoring, review of multiple databases, neurological assessment, discussion with family, other specialists and medical decision making of high complexity. I spent 40 minutes of neurocritical care time in the care of this patient.   To contact Stroke Continuity provider, please refer to [email or web address]. After hours, contact General Neurology

## 2024-10-09 NOTE — Progress Notes (Signed)
MB performed maintenance on electrodes. All impedances are below 10k ohms. No skin breakdown noted.  

## 2024-10-09 NOTE — Plan of Care (Addendum)
 1000:  Pt transported to MRI.  Pt required PRN Haladol and Ativan  prior to transport and PRN Ativan  half way through the MRI due to movement and agitation; See MAR.  MRI completed and Dr. JERRI and Dr. Harold made aware.   1200:  NG tube placement per verbal order by Dr. Jerri.  1250:  Dr. Jerri rounded on pt.  Family to include husband, daughters and grand daughters at bedside and updated.  See provider note.  1757:  Removed 2 gold colored wedding bands and a gold colored wedding band with a clear gem attached from pts left ring finger. Pts daughter Lucie Lodge received the three rings at bedside.   Problem: Education: Goal: Knowledge of disease or condition will improve Outcome: Not Progressing Goal: Knowledge of secondary prevention will improve (MUST DOCUMENT ALL) Outcome: Not Progressing Goal: Knowledge of patient specific risk factors will improve (DELETE if not current risk factor) Outcome: Not Progressing   Problem: Ischemic Stroke/TIA Tissue Perfusion: Goal: Complications of ischemic stroke/TIA will be minimized Outcome: Not Progressing   Problem: Coping: Goal: Will verbalize positive feelings about self Outcome: Not Progressing Goal: Will identify appropriate support needs Outcome: Not Progressing   Problem: Health Behavior/Discharge Planning: Goal: Ability to manage health-related needs will improve Outcome: Not Progressing Goal: Goals will be collaboratively established with patient/family Outcome: Not Progressing   Problem: Self-Care: Goal: Ability to participate in self-care as condition permits will improve Outcome: Not Progressing Goal: Verbalization of feelings and concerns over difficulty with self-care will improve Outcome: Not Progressing Goal: Ability to communicate needs accurately will improve Outcome: Not Progressing   Problem: Nutrition: Goal: Risk of aspiration will decrease Outcome: Not Progressing Goal: Dietary intake will improve Outcome: Not  Progressing   Problem: Education: Goal: Knowledge of General Education information will improve Description: Including pain rating scale, medication(s)/side effects and non-pharmacologic comfort measures Outcome: Not Progressing   Problem: Health Behavior/Discharge Planning: Goal: Ability to manage health-related needs will improve Outcome: Not Progressing   Problem: Clinical Measurements: Goal: Ability to maintain clinical measurements within normal limits will improve Outcome: Not Progressing Goal: Will remain free from infection Outcome: Not Progressing Goal: Diagnostic test results will improve Outcome: Not Progressing Goal: Respiratory complications will improve Outcome: Not Progressing Goal: Cardiovascular complication will be avoided Outcome: Not Progressing   Problem: Activity: Goal: Risk for activity intolerance will decrease Outcome: Not Progressing   Problem: Nutrition: Goal: Adequate nutrition will be maintained Outcome: Not Progressing   Problem: Coping: Goal: Level of anxiety will decrease Outcome: Not Progressing   Problem: Elimination: Goal: Will not experience complications related to bowel motility Outcome: Not Progressing Goal: Will not experience complications related to urinary retention Outcome: Not Progressing   Problem: Pain Managment: Goal: General experience of comfort will improve and/or be controlled Outcome: Not Progressing   Problem: Safety: Goal: Ability to remain free from injury will improve Outcome: Not Progressing   Problem: Skin Integrity: Goal: Risk for impaired skin integrity will decrease Outcome: Not Progressing   Problem: Education: Goal: Ability to describe self-care measures that may prevent or decrease complications (Diabetes Survival Skills Education) will improve Outcome: Not Progressing Goal: Individualized Educational Video(s) Outcome: Not Progressing   Problem: Coping: Goal: Ability to adjust to condition or  change in health will improve Outcome: Not Progressing   Problem: Fluid Volume: Goal: Ability to maintain a balanced intake and output will improve Outcome: Not Progressing   Problem: Health Behavior/Discharge Planning: Goal: Ability to identify and utilize available resources and services will improve  Outcome: Not Progressing Goal: Ability to manage health-related needs will improve Outcome: Not Progressing   Problem: Metabolic: Goal: Ability to maintain appropriate glucose levels will improve Outcome: Not Progressing   Problem: Nutritional: Goal: Maintenance of adequate nutrition will improve Outcome: Not Progressing Goal: Progress toward achieving an optimal weight will improve Outcome: Not Progressing   Problem: Skin Integrity: Goal: Risk for impaired skin integrity will decrease Outcome: Not Progressing   Problem: Tissue Perfusion: Goal: Adequacy of tissue perfusion will improve Outcome: Not Progressing   Problem: Activity: Goal: Ability to tolerate increased activity will improve Outcome: Not Progressing   Problem: Respiratory: Goal: Ability to maintain a clear airway and adequate ventilation will improve Outcome: Not Progressing   Problem: Role Relationship: Goal: Method of communication will improve Outcome: Not Progressing   Problem: Education: Goal: Understanding of CV disease, CV risk reduction, and recovery process will improve Outcome: Not Progressing Goal: Individualized Educational Video(s) Outcome: Not Progressing   Problem: Activity: Goal: Ability to return to baseline activity level will improve Outcome: Not Progressing   Problem: Cardiovascular: Goal: Ability to achieve and maintain adequate cardiovascular perfusion will improve Outcome: Not Progressing Goal: Vascular access site(s) Level 0-1 will be maintained Outcome: Not Progressing

## 2024-10-10 ENCOUNTER — Inpatient Hospital Stay (HOSPITAL_COMMUNITY)

## 2024-10-10 DIAGNOSIS — I724 Aneurysm of artery of lower extremity: Secondary | ICD-10-CM

## 2024-10-10 LAB — HEPATIC FUNCTION PANEL
ALT: 12 U/L (ref 0–44)
AST: 32 U/L (ref 15–41)
Albumin: 3.5 g/dL (ref 3.5–5.0)
Alkaline Phosphatase: 61 U/L (ref 38–126)
Bilirubin, Direct: 0.2 mg/dL (ref 0.0–0.2)
Indirect Bilirubin: 0.3 mg/dL (ref 0.3–0.9)
Total Bilirubin: 0.5 mg/dL (ref 0.0–1.2)
Total Protein: 5.7 g/dL — ABNORMAL LOW (ref 6.5–8.1)

## 2024-10-10 LAB — CBC
HCT: 28 % — ABNORMAL LOW (ref 36.0–46.0)
Hemoglobin: 9.2 g/dL — ABNORMAL LOW (ref 12.0–15.0)
MCH: 30.2 pg (ref 26.0–34.0)
MCHC: 32.9 g/dL (ref 30.0–36.0)
MCV: 91.8 fL (ref 80.0–100.0)
Platelets: 208 10*3/uL (ref 150–400)
RBC: 3.05 MIL/uL — ABNORMAL LOW (ref 3.87–5.11)
RDW: 13 % (ref 11.5–15.5)
WBC: 10.7 10*3/uL — ABNORMAL HIGH (ref 4.0–10.5)
nRBC: 0 % (ref 0.0–0.2)

## 2024-10-10 LAB — BASIC METABOLIC PANEL WITH GFR
Anion gap: 7 (ref 5–15)
BUN: 12 mg/dL (ref 8–23)
CO2: 27 mmol/L (ref 22–32)
Calcium: 8.6 mg/dL — ABNORMAL LOW (ref 8.9–10.3)
Chloride: 105 mmol/L (ref 98–111)
Creatinine, Ser: 0.61 mg/dL (ref 0.44–1.00)
GFR, Estimated: >60 mL/min
Glucose, Bld: 95 mg/dL (ref 70–99)
Potassium: 4 mmol/L (ref 3.5–5.1)
Sodium: 139 mmol/L (ref 135–145)

## 2024-10-10 LAB — GLUCOSE, CAPILLARY
Glucose-Capillary: 133 mg/dL — ABNORMAL HIGH (ref 70–99)
Glucose-Capillary: 80 mg/dL (ref 70–99)
Glucose-Capillary: 91 mg/dL (ref 70–99)
Glucose-Capillary: 94 mg/dL (ref 70–99)
Glucose-Capillary: 95 mg/dL (ref 70–99)
Glucose-Capillary: 97 mg/dL (ref 70–99)

## 2024-10-10 LAB — VITAMIN B12: Vitamin B-12: 876 pg/mL (ref 180–914)

## 2024-10-10 LAB — AMMONIA: Ammonia: 41 umol/L — ABNORMAL HIGH (ref 9–35)

## 2024-10-10 LAB — MAGNESIUM: Magnesium: 1.9 mg/dL (ref 1.7–2.4)

## 2024-10-10 LAB — PHOSPHORUS: Phosphorus: 2.9 mg/dL (ref 2.5–4.6)

## 2024-10-10 LAB — TSH: TSH: 2.11 u[IU]/mL (ref 0.350–4.500)

## 2024-10-10 MED ORDER — SODIUM CHLORIDE 0.9 % IV BOLUS: 250.0000 mL | Freq: Once | INTRAVENOUS | Status: DC

## 2024-10-10 MED ORDER — MAGNESIUM SULFATE 2 GM/50ML IV SOLN
2.0000 g | Freq: Once | INTRAVENOUS | Status: AC
  Administered 2024-10-10: 2 g via INTRAVENOUS
  Filled 2024-10-10: qty 50

## 2024-10-10 MED ORDER — LEVETIRACETAM 100 MG/ML PO SOLN
500.0000 mg | Freq: Two times a day (BID) | ORAL | Status: DC
  Administered 2024-10-10: 500 mg
  Filled 2024-10-10 (×2): qty 5

## 2024-10-10 MED ORDER — QUETIAPINE FUMARATE 50 MG PO TABS
75.0000 mg | ORAL_TABLET | Freq: Two times a day (BID) | ORAL | Status: AC
  Administered 2024-10-10: 75 mg
  Filled 2024-10-10 (×2): qty 1

## 2024-10-10 MED ORDER — CLONAZEPAM 1 MG PO TABS: 1.0000 mg | ORAL_TABLET | Freq: Two times a day (BID) | ORAL | Status: AC

## 2024-10-10 MED ORDER — SODIUM CHLORIDE 0.9 % IV BOLUS
500.0000 mL | Freq: Once | INTRAVENOUS | Status: AC
  Administered 2024-10-10: 500 mL via INTRAVENOUS

## 2024-10-10 MED ORDER — OSMOLITE 1.5 CAL PO LIQD
1000.0000 mL | ORAL | Status: AC
  Administered 2024-10-10: 25 mL

## 2024-10-10 MED ORDER — HALOPERIDOL LACTATE 5 MG/ML IJ SOLN
1.0000 mg | Freq: Four times a day (QID) | INTRAMUSCULAR | Status: AC | PRN
  Administered 2024-10-10: 1 mg via INTRAVENOUS
  Filled 2024-10-10: qty 1

## 2024-10-10 MED ORDER — THIAMINE MONONITRATE 100 MG PO TABS
100.0000 mg | ORAL_TABLET | Freq: Every day | ORAL | Status: AC
  Administered 2024-10-10: 100 mg
  Filled 2024-10-10: qty 1

## 2024-10-10 MED ORDER — CLONAZEPAM 1 MG PO TABS
1.0000 mg | ORAL_TABLET | Freq: Once | ORAL | Status: AC
  Administered 2024-10-10: 1 mg
  Filled 2024-10-10: qty 1

## 2024-10-10 MED ORDER — PROSOURCE TF20 ENFIT COMPATIBL EN LIQD
60.0000 mL | Freq: Every day | ENTERAL | Status: AC
  Administered 2024-10-10: 60 mL
  Filled 2024-10-10: qty 60

## 2024-10-10 MED ORDER — CLONAZEPAM 1 MG PO TABS
1.0000 mg | ORAL_TABLET | Freq: Three times a day (TID) | ORAL | Status: DC
  Administered 2024-10-10: 1 mg
  Filled 2024-10-10 (×2): qty 1

## 2024-10-10 MED ORDER — QUETIAPINE FUMARATE 50 MG PO TABS
75.0000 mg | ORAL_TABLET | Freq: Once | ORAL | Status: AC
  Administered 2024-10-10: 75 mg
  Filled 2024-10-10: qty 1

## 2024-10-10 NOTE — TOC Initial Note (Signed)
 Transition of Care [facility]) - Initial/Assessment Note    Patient Details  Name: Alice Lara MRN: [id number] Date of Birth: [birth year]  [facility]) CM/SW Contact:    Inocente GORMAN Kindle, LCSW Phone Number: 10/10/2024, 9:40 AM  Clinical Narrative:                 Patient admitted from home with spouse undergoing workup for Left MCA due to M2 occlusion. She was working prior to admission. Currently confused and plan for [place]. ICM will continue to follow for needs.     Barriers to Discharge: Continued Medical Work up   Patient Goals and CMS Choice            Expected Discharge Plan and Services       Living arrangements for the past 2 months: Single Family Home                                      Prior Living Arrangements/Services Living arrangements for the past 2 months: Single Family Home Lives with:: Spouse Patient language and need for interpreter reviewed:: Yes        Need for Family Participation in Patient Care: Yes (Comment) Care giver support system in place?: Yes (comment)   Criminal Activity/Legal Involvement Pertinent to Current Situation/Hospitalization: No - Comment as needed  Activities of Daily Living   ADL Screening (condition at time of admission) Independently performs ADLs?: Yes (appropriate for developmental age)  Permission Sought/Granted                  Emotional Assessment Appearance:: Appears stated age Attitude/Demeanor/Rapport: Unable to Assess Affect (typically observed): Unable to Assess Orientation: :  (unable to follow commands) Alcohol / Substance Use: Not Applicable Psych Involvement: No (comment)  Admission diagnosis:  Stroke (cerebrum) (HCC) [I63.9] Cerebrovascular accident (CVA) due to occlusion of middle cerebral artery, unspecified blood vessel laterality (HCC) [I63.519] Patient Active Problem List   Diagnosis Date Noted   Acute respiratory failure with hypoxia (HCC) 10/08/2024   Anemia  10/08/2024   Stroke (cerebrum) (HCC) 10/07/2024   Seizure (HCC) 10/07/2024   History of left common carotid artery stent placement 10/07/2024   History of thrombectomy 10/07/2024   PCP:  Cleotilde Bethena BRAVO, PA Pharmacy:   [id number] [address], [place] - [address] [place] [address] Phone: [phone] Fax: [phone]     Social Drivers of Health (SDOH) Social History: SDOH Screenings   Tobacco Use: High Risk (10/07/2024)   SDOH Interventions:     Readmission Risk Interventions     No data to display

## 2024-10-10 NOTE — Progress Notes (Signed)
 PT Cancellation Note  Patient Details Name: Alice Lara MRN: [id number] DOB: [birth year]   Cancelled Treatment:    Reason Eval/Treat Not Completed: Other (comment). Per RN pt remains unable to follow commands and has been agitated and restless when awake during the day. PT will attempt to follow up for evaluation as time allows.   Bernardino JINNY Ruth 10/10/2024, 4:48 PM

## 2024-10-10 NOTE — Progress Notes (Signed)
 Initial Nutrition Assessment  DOCUMENTATION CODES:   Not applicable  INTERVENTION:   Initiate tube feeding via Cortrak tube: Osmolite 1.5 at 25 ml/h and increase by 10 ml every 8 hours to goal rate of 45 ml/hr (1080 ml per day)  Prosource TF20 60 ml daily  Provides 1700 kcal, 87 gm protein, 820 ml free water  daily   Per adult TF protocol, monitor magnesium  and phosphorus daily x 4 occurrences or until WNL, MD to replete as needed.   100 mg thiamine  daily   NUTRITION DIAGNOSIS:   Inadequate oral intake related to lethargy/confusion as evidenced by NPO status.  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    Pt discussed during ICU rounds and with RN and MD.  Pt lethargic unable to take PO, cortrak placed.  Noted tongue swelling due to sz and possible reaction to TNK.   2/3 - s/p thrombectomy with stent; pt had witnessed seizure 2/4 - extubated 2/6 - agitated, s/p cortrak tube; tip proximal gastric per xray   Spoke with husband who is at bedside. He reports good appetite and no recent weight changes PTA.  Breakfast: 2 sausage and egg biscuits Lunch: leftovers or goes out with work friends Dinner: sandwich or snacks Snacks a lot on [employer] coke, no ETOH/drugs Pt does smoke, 1 pack per day but does not smoke most of the cigarette.     Medications reviewed and include: pepcid , SSI every 4 hours, miralax , senna  Labs reviewed:  Refeeding Labs: Recent Labs  Lab 10/07/24 1629 10/08/24 0605 10/09/24 0548 10/10/24 0600  K  --  4.3 4.0 4.0  MG 2.0  --   --   --    Glucose Profile:  Recent Labs    10/10/24 0339 10/10/24 0722 10/10/24 1114  GLUCAP 94 97 80   Lab Results  Component  Value Date   HGBA1C 5.6 10/07/2024      NUTRITION - FOCUSED PHYSICAL EXAM:  Flowsheet Row Most Recent Value  Orbital Region No depletion  Upper Arm Region No depletion  Thoracic and Lumbar Region No depletion  Buccal Region Mild depletion  Temple Region No depletion  Clavicle Bone Region No depletion  Clavicle and Acromion Bone Region Moderate depletion  Scapular Bone Region Unable to assess  Dorsal Hand Unable to assess  Patellar Region Moderate depletion  Anterior Thigh Region Moderate depletion  Posterior Calf Region Moderate depletion  Edema (RD Assessment) None  Hair Reviewed  Eyes Unable to assess  Mouth Unable to assess  [tongue swollen]  Skin Reviewed  Nails Unable to assess    Diet Order:   Diet Order             Diet NPO time specified  Diet effective now                   EDUCATION NEEDS:   Education needs have been addressed (with family)  Skin:  Skin Assessment: Reviewed RN Assessment  Last BM:  2/6 x 2 large/type 7 via pouch  Height:   Ht Readings from Last 1 Encounters:  10/08/24 5' 6 (1.676 m)    Weight:   Wt Readings from Last 1 Encounters:  10/07/24 68 kg  BMI:  Body mass index is 24.21 kg/m.  Estimated Nutritional Needs:   Kcal:  1600-1800  Protein:  85-100 grams  Fluid:  >1.6 L/day  Powell SQUIBB., RD, LDN, CNSC See AMiON for contact information

## 2024-10-10 NOTE — Procedures (Signed)
 Patient Name: Alice Lara  MRN: [id number]  Epilepsy Attending: Arlin MALVA Krebs  Referring Physician/Provider: Antonetta Vina NOVAK, NP  Duration: 10/09/2024 2000 to 10/10/2024 1130   Patient history: 69yo F with new onset seizure. EEG to evaluate for seizure.   Level of alertness: awake, asleep   AEDs during EEG study: LEV   Technical aspects: This EEG study was done with scalp electrodes positioned according to the 10-20 International system of electrode placement. Electrical activity was reviewed with band pass filter of 1-70Hz , sensitivity of 7 uV/mm, display speed of 77mm/sec with a 60Hz  notched filter applied as appropriate. EEG data were recorded continuously and digitally stored.  Video monitoring was available and reviewed as appropriate.   Description: EEG initially showed continuous generalized sharply contoured 3 to 6 Hz theta-delta slowing. Additionally there were sharply contoured quasi periodic 0.25 to 1 Hz discharges noted in bilateral posterior quadrant. Gradually EEG improved and showed ontinuous generalized 6-9hz  theta-alpha activity admixed with 2-3hz  delta slowing. Sleep was characterized by sleep spindles (12-14Hz ), maximal fronto-central region. Hyperventilation and photic stimulation were not performed.      ABNORMALITY - Continuous slow, generalized    IMPRESSION: This study was suggestive of generalized cerebral dysfunction (encephalopathy). No seizures or definite epileptiform discharges were noted.   [place] O Kessa Fairbairn

## 2024-10-10 NOTE — Progress Notes (Signed)
 SLP Cancellation Note  Patient Details Name: Alice Lara MRN: [id number] DOB: Feb 01, 1956   Cancelled treatment:       Reason Eval/Treat Not Completed: Fatigue/lethargy limiting ability to participate (just received ativan , RN asks SLP to hold evaluation until pt is more alert). Will f/u as able.    Damien Blumenthal, M.A., CCC-SLP Speech Language Pathology, Acute Rehabilitation Services  Secure Chat preferred [phone]  10/10/2024, 10:26 AM

## 2024-10-10 NOTE — Progress Notes (Signed)
 eLink Physician-Brief Progress Note Patient Name: Alice Lara DOB: 1956-08-24 MRN: [id number]   Date of Service  10/10/2024  HPI/Events of Note  Patient here with stroke/ seizures s/p TNK/ Thrombectomy/ L Carotid Stent. Was extubated 2/4 and they have been having issues with agitation since. She was previously on Dex- which they were able to get her off w/ addition of Klonopin / Seroquel ; however, even with this she was requiring PRN ativan . During day shift today they discontinued the Ativan . Also increased the Klonopin  but she received 1mg  at 1531 and won't receive more until 10am tomorrow.   RN calling regarding agitation for patient. States he was trying to hold off and wait until she can get her nighttime scheduled Seroquel  - but she is extremely restless and pulling at things. RN asking if she can have a PRN to manage this?   eICU Interventions  Cameraed in. Looks comfortable.  Qtc 470.  Low dose haldol  prn ordered.      Intervention Category Minor Interventions: Agitation / anxiety - evaluation and management  Alice Lara 10/10/2024, 9:54 PM

## 2024-10-10 NOTE — Plan of Care (Addendum)
 9149:  CCM PA rounded on pt.  See provider note.    0945:  Cortrak placed at bedside.  See note.  1031:  Dr. Jerri rounded on pt.  See provider note.  1144: Dr. Lester rounded on pt. Daughter updated at bedside. See note.   Problem: Education: Goal: Knowledge of disease or condition will improve Outcome: Not Progressing Goal: Knowledge of secondary prevention will improve (MUST DOCUMENT ALL) Outcome: Not Progressing Goal: Knowledge of patient specific risk factors will improve (DELETE if not current risk factor) Outcome: Not Progressing   Problem: Ischemic Stroke/TIA Tissue Perfusion: Goal: Complications of ischemic stroke/TIA will be minimized Outcome: Not Progressing   Problem: Coping: Goal: Will verbalize positive feelings about self Outcome: Not Progressing Goal: Will identify appropriate support needs Outcome: Not Progressing   Problem: Health Behavior/Discharge Planning: Goal: Ability to manage health-related needs will improve Outcome: Not Progressing Goal: Goals will be collaboratively established with patient/family Outcome: Not Progressing   Problem: Self-Care: Goal: Ability to participate in self-care as condition permits will improve Outcome: Not Progressing Goal: Verbalization of feelings and concerns over difficulty with self-care will improve Outcome: Not Progressing Goal: Ability to communicate needs accurately will improve Outcome: Not Progressing   Problem: Nutrition: Goal: Risk of aspiration will decrease Outcome: Not Progressing Goal: Dietary intake will improve Outcome: Not Progressing   Problem: Education: Goal: Knowledge of General Education information will improve Description: Including pain rating scale, medication(s)/side effects and non-pharmacologic comfort measures Outcome: Not Progressing   Problem: Health Behavior/Discharge Planning: Goal: Ability to manage health-related needs will improve Outcome: Not Progressing   Problem: Clinical  Measurements: Goal: Ability to maintain clinical measurements within normal limits will improve Outcome: Not Progressing Goal: Will remain free from infection Outcome: Not Progressing Goal: Diagnostic test results will improve Outcome: Not Progressing Goal: Respiratory complications will improve Outcome: Not Progressing Goal: Cardiovascular complication will be avoided Outcome: Not Progressing   Problem: Activity: Goal: Risk for activity intolerance will decrease Outcome: Not Progressing   Problem: Nutrition: Goal: Adequate nutrition will be maintained Outcome: Not Progressing   Problem: Coping: Goal: Level of anxiety will decrease Outcome: Not Progressing   Problem: Elimination: Goal: Will not experience complications related to bowel motility Outcome: Not Progressing Goal: Will not experience complications related to urinary retention Outcome: Not Progressing   Problem: Pain Managment: Goal: General experience of comfort will improve and/or be controlled Outcome: Not Progressing   Problem: Safety: Goal: Ability to remain free from injury will improve Outcome: Not Progressing   Problem: Skin Integrity: Goal: Risk for impaired skin integrity will decrease Outcome: Not Progressing   Problem: Education: Goal: Ability to describe self-care measures that may prevent or decrease complications (Diabetes Survival Skills Education) will improve Outcome: Not Progressing Goal: Individualized Educational Video(s) Outcome: Not Progressing   Problem: Coping: Goal: Ability to adjust to condition or change in health will improve Outcome: Not Progressing   Problem: Fluid Volume: Goal: Ability to maintain a balanced intake and output will improve Outcome: Not Progressing   Problem: Health Behavior/Discharge Planning: Goal: Ability to identify and utilize available resources and services will improve Outcome: Not Progressing Goal: Ability to manage health-related needs will  improve Outcome: Not Progressing   Problem: Metabolic: Goal: Ability to maintain appropriate glucose levels will improve Outcome: Not Progressing   Problem: Nutritional: Goal: Maintenance of adequate nutrition will improve Outcome: Not Progressing Goal: Progress toward achieving an optimal weight will improve Outcome: Not Progressing   Problem: Skin Integrity: Goal: Risk for impaired skin integrity  will decrease Outcome: Not Progressing   Problem: Tissue Perfusion: Goal: Adequacy of tissue perfusion will improve Outcome: Not Progressing   Problem: Activity: Goal: Ability to tolerate increased activity will improve Outcome: Not Progressing   Problem: Respiratory: Goal: Ability to maintain a clear airway and adequate ventilation will improve Outcome: Not Progressing   Problem: Role Relationship: Goal: Method of communication will improve Outcome: Not Progressing   Problem: Education: Goal: Understanding of CV disease, CV risk reduction, and recovery process will improve Outcome: Not Progressing Goal: Individualized Educational Video(s) Outcome: Not Progressing   Problem: Activity: Goal: Ability to return to baseline activity level will improve Outcome: Not Progressing   Problem: Cardiovascular: Goal: Ability to achieve and maintain adequate cardiovascular perfusion will improve Outcome: Not Progressing Goal: Vascular access site(s) Level 0-1 will be maintained Outcome: Not Progressing

## 2024-10-10 NOTE — Progress Notes (Signed)
 vLTM Maintenance  All impedances below 10kohms.  No skin breakdown noted at  FP1  FP2 01 02

## 2024-10-10 NOTE — Plan of Care (Signed)
" °  Problem: Health Behavior/Discharge Planning: Goal: Goals will be collaboratively established with patient/family Outcome: Progressing   Problem: Clinical Measurements: Goal: Respiratory complications will improve Outcome: Progressing   Problem: Elimination: Goal: Will not experience complications related to bowel motility Outcome: Progressing   Problem: Education: Goal: Knowledge of disease or condition will improve Outcome: Not Progressing   Problem: Ischemic Stroke/TIA Tissue Perfusion: Goal: Complications of ischemic stroke/TIA will be minimized Outcome: Not Progressing   Problem: Coping: Goal: Will verbalize positive feelings about self Outcome: Not Progressing Goal: Will identify appropriate support needs Outcome: Not Progressing   Problem: Health Behavior/Discharge Planning: Goal: Ability to manage health-related needs will improve Outcome: Not Progressing   Problem: Self-Care: Goal: Ability to participate in self-care as condition permits will improve Outcome: Not Progressing Goal: Verbalization of feelings and concerns over difficulty with self-care will improve Outcome: Not Progressing Goal: Ability to communicate needs accurately will improve Outcome: Not Progressing   Problem: Nutrition: Goal: Risk of aspiration will decrease Outcome: Not Progressing Goal: Dietary intake will improve Outcome: Not Progressing   Problem: Education: Goal: Knowledge of General Education information will improve Description: Including pain rating scale, medication(s)/side effects and non-pharmacologic comfort measures Outcome: Not Progressing   Problem: Health Behavior/Discharge Planning: Goal: Ability to manage health-related needs will improve Outcome: Not Progressing   Problem: Clinical Measurements: Goal: Ability to maintain clinical measurements within normal limits will improve Outcome: Not Progressing   Problem: Activity: Goal: Risk for activity intolerance  will decrease Outcome: Not Progressing   Problem: Nutrition: Goal: Adequate nutrition will be maintained Outcome: Not Progressing   Problem: Coping: Goal: Level of anxiety will decrease Outcome: Not Progressing   Problem: Elimination: Goal: Will not experience complications related to urinary retention Outcome: Not Progressing   "

## 2024-10-10 NOTE — Progress Notes (Signed)
 "  NAME:  Alice Lara, MRN:  [id number], DOB:  1956/04/27, LOS: 3 ADMISSION DATE:  10/07/2024, CONSULTATION DATE:  10/07/24 REFERRING MD:  Dr. Michaela, CHIEF COMPLAINT:  post op L M2 thrombectomy, l carotid stent   History of Present Illness:  Alice Lara is a 69 y.o. female with PMH of HTN, tobacco use who presented to the ED today with reported syncopal episode and seizure like activity. Patient was reportedly at work today when she began feeling lightheaded, had apparent seizure like activity with postictal state for approximately 10 minutes. Patient then reportedly returned backed to her baseline, fully awake and oriented. While in the ED patient had a second witnessed seizure, loaded with keppra , and given 4mg  Ativan . It was noticed patient had new neurological deficits to include not moving the RUE. 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 ED discussed case with neurology, patient was given TNK, and taken to IR for thrombectomy and carotid artery stent placement.  Patient remains intubated, sedated on propofol  and fentanyl . cEEG monitoring ordered, with continuation of Keppra  500mg  BID. Repeat CTH ordered for tonight, and MRI tomorrow.   Pertinent  Medical History   has no past medical history on file.   Significant Hospital Events: Including procedures, antibiotic start and stop dates in addition to other pertinent events   2/3: PCCM consulted s/p TNK, left MCA M2 LVO s/p thrombectomy, left carotid artery stent 2/4: extubated  2/6: remains agitated requiring intermittent ativan    Interim History / Subjective:  Remains agitated requiring intermittent PRN ativan . Does not follow commands.   Objective    Blood pressure (!) 137/90, pulse 95, temperature 98.1 F (36.7 C), temperature source Axillary, resp. rate (!) 29, height 5' 6 (1.676 m), weight 68 kg, SpO2 95%.        Intake/Output Summary (Last 24 hours) at 10/10/2024  0859 Last data filed at 10/10/2024 0800 Gross per 24 hour  Intake 733.29 ml  Output 330 ml  Net 403.29 ml   Filed Weights   10/07/24 1149  Weight: 68 kg   Physical Exam: General: acutely ill appearing elderly female, moving around in bed HENT: Barre/AT, NGT in place, PERRL  Neuro: does not open eyes, moves all extremities spontaneously, BLE with good strength, LUE>RUE, does not follow commands Pulm: breathing comfortably on nasal cannula, diminished bilateral bases CV: RRR, S1S2 GI: soft, non-distended, +BS, rectal tube in place with brown output GU: deferred  Skin: warm, dry, right groin ecchymosis with no palpable masses  MRI brain 2/6 with left MCA CVA and cytotoxic edema with small petechial hemorrhage, no mass effect  MR angio 2/6 patent intracranial circulation  WBC improving  Hgb 9.2   Resolved problem list   Acute Respiratory Insufficiency, Intubated in IR, extubated 2/4 Tongue Swelling, suspected 2/2 bite during seizure with active bleeding, possibly exacerbated with TNK. Given decadron  and benadryl  2/3  Assessment and Plan   New Onset Seizure; EEG 2/5 with continuous slow, generalized and maximal bilateral posterior quadrant  Left MCA due to M2 occlusion s/p TNK and Thrombectomy 2/3 Left ICA occlusion s/p stent 2/3 HLD - Management per neurology  - SBP goal 120-160 - cEEG monitoring, likely discontinue today  - Continue keppra  500 mg BID - Continue ASA, Brillinta  - Continue statin   Acute encephalopathy, in the setting of above  Agitation  - Increase Seroquel  75 mg BID - Increase clonazepam  1 mg TID - Continue PRN Ativan   Acute normocytic anemia Right femoral artery pseudoaneurysm  - No active signs of bleeding - Continue to trend daily CBC - Planning for repeat artery doppler today   HTN - Holding PTA losartan-HCTZ  Tobacco abuse - Encourage cessation when able   Leukocytosis, likely reactive, improving. No localizing signs of infection -  Continue to monitor CBC and fever curve - Low threshold to culture if febrile or clinically worsens   Dysphagia - SLP consulted - Plan for cortrak today, start tube feeds when placed. RD consulted   Critical care time:     Alice Lara [facility] 10/10/24 8:59 AM  Please see [email or web address] for pager details.  From 7A-7P if no response, please call [phone] After hours, please call ELink [phone]    "

## 2024-10-10 NOTE — Progress Notes (Signed)
 Right pseudoaneurysm surveillance has been completed. Preliminary results can be found in CV Proc through chart review.   10/10/24 10:09 AM Cathlyn Collet RVT

## 2024-10-10 NOTE — Progress Notes (Signed)
 STROKE TEAM PROGRESS NOTE   INTERIM HISTORY/SUBJECTIVE No family is at the bedside. Pt was on ativan  PRN, on clonazepam  and seroquel . Needed several ativan  last night. Now sedated, not open eyes and not following commands. Some movement of BUEs on pain stimulation  OBJECTIVE  CBC    Component Value Date/Time   WBC 10.7 (H) 10/10/2024 0600   RBC 3.05 (L) 10/10/2024 0600   HGB 9.2 (L) 10/10/2024 0600   HCT 28.0 (L) 10/10/2024 0600   PLT 208 10/10/2024 0600   MCV 91.8 10/10/2024 0600   MCH 30.2 10/10/2024 0600   MCHC 32.9 10/10/2024 0600   RDW 13.0 10/10/2024 0600   LYMPHSABS 1.6 10/07/2024 1126   MONOABS 0.5 10/07/2024 1126   EOSABS 0.1 10/07/2024 1126   BASOSABS 0.1 10/07/2024 1126    BMET    Component Value Date/Time   NA 139 10/10/2024 0600   K 4.0 10/10/2024 0600   CL 105 10/10/2024 0600   CO2 27 10/10/2024 0600   GLUCOSE 95 10/10/2024 0600   BUN 12 10/10/2024 0600   CREATININE 0.61 10/10/2024 0600   CALCIUM  8.6 (L) 10/10/2024 0600   GFRNONAA >60 10/10/2024 0600    IMAGING past 24 hours VAS US  GROIN PSEUDOANEURYSM Result Date: 10/10/2024  ARTERIAL PSEUDOANEURYSM  Patient Name:  Alice Lara  Date of Exam:   10/10/2024 Medical Rec #: [id number]       Accession #:    [id number] Date of Birth: 11-27-1955       Patient Gender: F Patient Age:   69 years Exam Location:  [facility] Procedure:      VAS US  Alice Lara Referring Phys: ARY CUMMINS --------------------------------------------------------------------------------  Exam: Right groin Indications: Patient complains of Confirmed pseudoaneurysm. Comparison Study: 10/08/2024 - Bilobed pseudoaneurysm with the largest measuring                   1.34 x 1.08 cm. Performing Technologist: Cordella Collet RVT  Examination Guidelines: A complete evaluation includes B-mode imaging, spectral Doppler, color Doppler, and power Doppler as needed of all accessible portions of each vessel. Bilateral testing is considered an  integral part of a complete examination. Limited examinations for reoccurring indications may be performed as noted. +------------+----------+---------+------+----------+ Right DuplexPSV (cm/s)Waveform PlaqueComment(s) +------------+----------+---------+------+----------+ CFA            196    triphasic                 +------------+----------+---------+------+----------+ Prox SFA       151    triphasic                 +------------+----------+---------+------+----------+  Findings: An area with well defined borders measuring 0.8 cm x 1.4 cm was visualized arising off of the common femoral artery with ultrasound characteristics of a nearly complete thrombosed pseudoaneurysm.  Diagnosing physician: Penne Colorado MD Electronically signed by Penne Colorado MD on 10/10/2024 at 2:28:03 PM.   --------------------------------------------------------------------------------    Final    DG Abd Portable 1V Result Date: 10/10/2024 CLINICAL DATA:  Feeding tube placement EXAM: PORTABLE ABDOMEN - 1 VIEW COMPARISON:  Yesterday FINDINGS: Feeding tube tip is seen in proximal stomach. IMPRESSION: Feeding tube tip seen in proximal stomach. Electronically Signed   By: Lynwood Landy Raddle M.D.   On: 10/10/2024 11:38    Vitals:   10/10/24 1730 10/10/24 1800 10/10/24 1815 10/10/24 1830  BP: (!) 125/52 (!) 83/44  (!) 131/59  Pulse: 78 69 70 68  Resp: 17 20 19  13  Temp:      TempSrc:      SpO2: 100% 98% 99% 99%  Weight:      Height:         PHYSICAL EXAM General: Ill-appearing elderly patient in no acute distress Psych:  Mood and affect appropriate for situation CV: Regular rate and rhythm on monitor Respiratory: Respirations regular and unlabored on supplemental O2   NEURO (seen after patient had received Ativan  for agitation):  Sedated, eyes closed, not open eyes, not following commands.  With forced eye opening, eyes midline, not blinking to visual threat, PERRL.  Mild right facial droop. Tongue  protrusion not corporative.  With pain stimulation, bilateral upper extremity withdraw and seems able to localize.  Bilateral lower extremity slight withdrawal. Sensation, coordination and gait not tested.    ASSESSMENT/PLAN  Alice Lara is a 69 y.o. female with history of hypertension, hypothyroidism and tobacco use admitted after a seizure at work and was found to have left MCA occlusion.  Patient was given TNK and taken for mechanical thrombectomy, which was successful.  Left carotid stent was placed.  She did have some complications at her groin site and was noted to have a pseudoaneurysm today, so we will follow-up with ultrasound again in 2 days.  She does have significant tongue swelling and did receive steroids and Benadryl .  Tongue swelling appears to be improving, so we will wait for resolution before attempting extubation.  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 s/p TNK and IR with TICI3 and L ICA stenting, etiology: Large vessel disease CT head abnormal density of the left MCA within the sylvian fissure concerning for thrombosis, subtle hypoattenuation in the white matter of the left MCA territory without definite loss of gray-white differentiation CTA head & neck occlusion of dominant left MCA M2 inferior division branch, acute occlusion of proximal left cervical ICA with distal reconstitution at the cavernous segment, moderate stenosis of right cavernous ICA MRI left MCA territory infarct with cytotoxic edema and petechial hemorrhage MRA no hemodynamically significant stenosis Carotid Doppler 60 to 79% stenosis of left ICA with high-grade stenosis noted in left middle to distal stent 2D Echo EF 55-60% LDL 139 HgbA1c 5.6 VTE prophylaxis -heparin  subq No antithrombotic prior to admission, now on aspirin  81 mg daily and Brilinta  (ticagrelor ) 90 mg bid  Therapy recommendations:  Pending Disposition: Pending  Tongue  swelling/possible allergic reaction Patient has significant swelling of the tongue Question allergic reaction to IV contrast or TNK Received steroids and Benadryl  swelling appears to be improving  Seizure Agitation  Patient had witnessed seizure at work prior to presentation Routine EEG demonstrates generalized slowing with no seizures or epileptiform discharges Off precedex  On Keppra  500 mg twice daily, given the unexplained agitation, will d/c keppra . Pt is on LTM EEG, if seizure recurs, will consider depakote or vimpat LTM EEG 2/4 to 2/5: Continuous slow, generalized and maximal bilateral posterior quadrant.  This study was suggestive of cortical dysfunction in bilateral posterior quadrant likely secondary to underlying structural abnormality.  Additionally there is generalized cerebral dysfunction (encephalopathy).   Sharply contoured waves were noted in bilateral posterior quadrant.  Recommend continued monitoring for further evaluation if these are epileptiform or not. on ativan  PRN on clonazepam  1mg  bid->tid and seroquel  50->75mg  bid   Femoral A pseudoaneurysm  LE artery doppler - An area with well defined borders measuring 1.3 cm x 1.1 cm was visualized arising off of the  common femoral artery with ultrasound characteristics of a pseudoaneurysm. The neck measures approximately 0.2 cm wide and 0.3 cm long.  Groin wound stable, soft Repeat artery doppler 2/6 nearly complete thrombosed pseudoaneurysm   Hypotension history of hypertension Home meds: Losartan-hydrochlorothiazide 50-12.5 mg daily Stable now, of Levophed  BP sensitive to propofol  and fentanyl  Wean off levophed  as able  Hyperlipidemia Home meds: None LDL 139, goal < 70 Add atorvastatin  40 mg daily Continue statin at discharge  Tobacco Abuse Patient smokes 1 pack/day On nicotine  patch Will discuss cessation when patient extubated  Dysphagia Patient has post-stroke dysphagia, SLP consulted NPO now Has NG  tube Consider TF in am  Other Stroke Risk Factors Advanced age  Other Active Problems Leukocytosis WBC 15.1-> 15.0->10.7 Fever Tmax 101.7->afebrile, UA neg  Hospital day # 3   Ary Cummins, MD PhD Stroke Neurology 10/10/2024 7:25 PM  This patient is critically ill due to stroke s/p TNK and thrombectomy, and stenting, seizure, hypotension, respiratory failure and at significant risk of neurological worsening, death form recurrent stroke, hemorrhagic conversion, status epilepticus, shock. This patient's care requires constant monitoring of vital signs, hemodynamics, respiratory and cardiac monitoring, review of multiple databases, neurological assessment, discussion with family, other specialists and medical decision making of high complexity. I spent 40 minutes of neurocritical care time in the care of this patient. I discussed with Dr. Lester SORENSON.  To contact Stroke Continuity provider, please refer to [email or web address]. After hours, contact General Neurology

## 2024-10-10 NOTE — Procedures (Signed)
 Cortrak  Person Inserting Tube:  Alice Lara, RD Tube Type:  Cortrak - 43 inches Tube Size:  10 Tube Location:  Right nare Secured by: Bridle Cortrak Secured At:  60 cm Initial Placement Verification:  Xray  Cortrak Tube Team Note:  Consult received to place a Cortrak feeding tube.   X-ray is required. RN may begin using tube once appropriate placement confirmed.   If the tube becomes dislodged please keep the tube and contact the Cortrak team at [email or web address] for replacement.  If after hours and replacement cannot be delayed, place a NG tube and confirm placement with an abdominal x-ray.   Lara Mady MS, RD, LDN Registered Dietitian I Clinical Nutrition RD Inpatient Contact Info in Amion

## 2024-10-11 MED ORDER — HALOPERIDOL LACTATE 5 MG/ML IJ SOLN: 1.0000 mg | Freq: Once | INTRAMUSCULAR | Status: AC
